# Patient Record
Sex: Female | Born: 1965 | Race: White | Hispanic: No | Marital: Married
Health system: Southern US, Community
[De-identification: ages and names within clinical notes are randomized; demographics above are authoritative.]

## PROBLEM LIST (undated history)

## (undated) ENCOUNTER — Ambulatory Visit: Admission: EM | Source: Home / Self Care

## (undated) DIAGNOSIS — L4050 Arthropathic psoriasis, unspecified: Secondary | ICD-10-CM

## (undated) DIAGNOSIS — Z9889 Other specified postprocedural states: Secondary | ICD-10-CM

## (undated) DIAGNOSIS — I10 Essential (primary) hypertension: Secondary | ICD-10-CM

## (undated) DIAGNOSIS — F329 Major depressive disorder, single episode, unspecified: Secondary | ICD-10-CM

## (undated) DIAGNOSIS — T8859XA Other complications of anesthesia, initial encounter: Secondary | ICD-10-CM

## (undated) DIAGNOSIS — J189 Pneumonia, unspecified organism: Secondary | ICD-10-CM

## (undated) DIAGNOSIS — F319 Bipolar disorder, unspecified: Secondary | ICD-10-CM

## (undated) DIAGNOSIS — F32A Depression, unspecified: Secondary | ICD-10-CM

## (undated) HISTORY — PX: TUBAL LIGATION: SHX77

## (undated) HISTORY — PX: JOINT REPLACEMENT: SHX530

---

## 2014-09-18 ENCOUNTER — Encounter (HOSPITAL_COMMUNITY): Payer: Self-pay | Admitting: Emergency Medicine

## 2014-09-18 ENCOUNTER — Emergency Department (HOSPITAL_COMMUNITY)
Admission: EM | Admit: 2014-09-18 | Discharge: 2014-09-18 | Disposition: A | Discharge status: Home / Self Care | Payer: BC Managed Care – PPO | Attending: Emergency Medicine | Admitting: Emergency Medicine

## 2014-09-18 ENCOUNTER — Emergency Department (HOSPITAL_COMMUNITY): Payer: BC Managed Care – PPO

## 2014-09-18 DIAGNOSIS — J209 Acute bronchitis, unspecified: Secondary | ICD-10-CM | POA: Insufficient documentation

## 2014-09-18 DIAGNOSIS — Z79899 Other long term (current) drug therapy: Secondary | ICD-10-CM | POA: Diagnosis not present

## 2014-09-18 DIAGNOSIS — R0602 Shortness of breath: Secondary | ICD-10-CM

## 2014-09-18 DIAGNOSIS — Z791 Long term (current) use of non-steroidal anti-inflammatories (NSAID): Secondary | ICD-10-CM | POA: Insufficient documentation

## 2014-09-18 DIAGNOSIS — Z91040 Latex allergy status: Secondary | ICD-10-CM | POA: Insufficient documentation

## 2014-09-18 DIAGNOSIS — J40 Bronchitis, not specified as acute or chronic: Secondary | ICD-10-CM

## 2014-09-18 MED ORDER — ALBUTEROL SULFATE (2.5 MG/3ML) 0.083% IN NEBU
5.0000 mg | INHALATION_SOLUTION | Freq: Once | RESPIRATORY_TRACT | Status: AC
  Administered 2014-09-18: 5 mg via RESPIRATORY_TRACT
  Filled 2014-09-18: qty 6

## 2014-09-18 MED ORDER — PREDNISONE 20 MG PO TABS
60.0000 mg | ORAL_TABLET | Freq: Once | ORAL | Status: AC
  Administered 2014-09-18: 60 mg via ORAL
  Filled 2014-09-18: qty 3

## 2014-09-18 MED ORDER — AZITHROMYCIN 250 MG PO TABS: 250.0000 mg | ORAL_TABLET | Freq: Every day | ORAL | Status: DC

## 2014-09-18 MED ORDER — AZITHROMYCIN 250 MG PO TABS
500.0000 mg | ORAL_TABLET | Freq: Once | ORAL | Status: AC
  Administered 2014-09-18: 500 mg via ORAL
  Filled 2014-09-18: qty 2

## 2014-09-18 MED ORDER — ALBUTEROL SULFATE HFA 108 (90 BASE) MCG/ACT IN AERS
2.0000 | INHALATION_SPRAY | RESPIRATORY_TRACT | Status: DC | PRN
  Administered 2014-09-18: 2 via RESPIRATORY_TRACT
  Filled 2014-09-18: qty 6.7

## 2014-09-18 MED ORDER — PREDNISONE 10 MG PO TABS: 20.0000 mg | ORAL_TABLET | Freq: Every day | ORAL | Status: DC

## 2014-09-18 MED ORDER — IPRATROPIUM BROMIDE 0.02 % IN SOLN
0.5000 mg | Freq: Once | RESPIRATORY_TRACT | Status: AC
  Administered 2014-09-18: 0.5 mg via RESPIRATORY_TRACT
  Filled 2014-09-18: qty 2.5

## 2014-09-18 NOTE — ED Notes (Signed)
Alert, NAD, calm, interactive, resps e/u, speaking in clear complete sentences, "feel better", ready to go".

## 2014-09-18 NOTE — ED Provider Notes (Signed)
CSN: [id number]     Arrival date & time 09/18/14  1802 History   First MD Initiated Contact with Patient 09/18/14 2126     Chief Complaint  Patient presents with  . Shortness of Breath     (Consider location/radiation/quality/duration/timing/severity/associated sxs/prior Treatment) HPI  The patient with a past medical history of anxiety and COPD presents to the emergency department with complaints of his congestion, coughing and wheezing. She reports being sick for the past 5 days and then today became acutely short of breath and wheezy today. She therefore came to the emergency department were in triage she was given a breathing treatment. Upon my evaluation she says that this has helped significantly she's feeling much better. She says she continues to have cough and nasal congestion and reports that she typically gets bronchitis once a year. She denies having any fevers, body aches, chills, nausea, vomiting, diarrhea, chest pain. Vital signs are within normal limits.  History reviewed. No pertinent past medical history. History reviewed. No pertinent past surgical history. No family history on file. History  Substance Use Topics  . Smoking status: Never Smoker   . Smokeless tobacco: Not on file  . Alcohol Use: No   OB History   Grav Para Term Preterm Abortions TAB SAB Ect Mult Living                 Review of Systems  10 Systems reviewed and are negative for acute change except as noted in the HPI.   Allergies  Latex  Home Medications   Prior to Admission medications   Medication Sig Start Date End Date Taking? Authorizing Provider  azithromycin (ZITHROMAX) 250 MG tablet Take 1 tablet (250 mg total) by mouth daily. 1 tab every day until finished. 09/19/14   Miron Marxen Irine SealG Marena Witts, PA-C  buPROPion (WELLBUTRIN XL) 300 MG 24 hr tablet Take 300 mg by mouth daily.  09/02/14  Yes Historical Provider, MD  escitalopram (LEXAPRO) 20 MG tablet Take 20 mg by mouth daily.  09/02/14  Yes  Historical Provider, MD  LORazepam (ATIVAN) 1 MG tablet Take 1 mg by mouth daily as needed for anxiety (severe agitation).  09/12/14  Yes Historical Provider, MD  naproxen sodium (ALEVE) 220 MG tablet Take 220 mg by mouth daily as needed (pain).   Yes Historical Provider, MD  predniSONE (DELTASONE) 10 MG tablet Take 2 tablets (20 mg total) by mouth daily. 09/18/14   Monty Mccarrell Irine SealG Capri Raben, PA-C  Pseudoephedrine-APAP-DM (DAYQUIL MULTI-SYMPTOM PO) Take 2 capsules by mouth every 6 (six) hours as needed (cold and flu symptoms).   Yes Historical Provider, MD  triamcinolone ointment (KENALOG) 0.5 % Apply 1 application topically daily as needed (rash).  09/12/14  Yes Historical Provider, MD   BP 126/74  Pulse 76  Temp(Src) 97.4 F (36.3 C) (Oral)  Resp 21  Ht 5\' 8"  (1.727 m)  Wt 275 lb (124.739 kg)  BMI 41.82 kg/m2  SpO2 93% Physical Exam  Nursing note and vitals reviewed. Constitutional: She appears well-developed and well-nourished. No distress.  HENT:  Head: Normocephalic and atraumatic.  Eyes: Pupils are equal, round, and reactive to light.  Neck: Normal range of motion. Neck supple. No spinous process tenderness and no muscular tenderness present.  Cardiovascular: Normal rate and regular rhythm.   Pulmonary/Chest: Effort normal. She has wheezes (very mild and bilateral). She has no rhonchi. She has no rales. She exhibits no tenderness and no laceration.  Coughing during exam  Abdominal: Soft. Bowel sounds are normal. She  exhibits no distension. There is no tenderness.  Neurological: She is alert.  Skin: Skin is warm and dry. She is not diaphoretic. No pallor.      ED Course  Procedures (including critical care time) Labs Review Labs Reviewed - No data to display  Imaging Review Dg Chest 2 View  09/18/2014   CLINICAL DATA:  Short of breath  EXAM: CHEST  2 VIEW  COMPARISON:  None.  FINDINGS: The heart size and mediastinal contours are within normal limits. Both lungs are clear. The  visualized skeletal structures are unremarkable.  IMPRESSION: No active cardiopulmonary disease.   Electronically Signed   By: Marlan Palauharles  Clark M.D.   On: 09/18/2014 19:32     EKG Interpretation None      MDM   Final diagnoses:  Bronchitis    Medications  albuterol (PROVENTIL HFA;VENTOLIN HFA) 108 (90 BASE) MCG/ACT inhaler 2 puff (not administered)  predniSONE (DELTASONE) tablet 60 mg (not administered)  azithromycin (ZITHROMAX) tablet 500 mg (not administered)  albuterol (PROVENTIL) (2.5 MG/3ML) 0.083% nebulizer solution 5 mg (5 mg Nebulization Given 09/18/14 1818)  ipratropium (ATROVENT) nebulizer solution 0.5 mg (0.5 mg Nebulization Given 09/18/14 1819)    Pt feeling much better after nebulizer and her saturations are staying in the upper 90's on room air and with ambulation. She feels well aside from cough and nasal congestion. Her vital signs are all normal. We'll give her an albuterol inhaler for home she has been advised to use it every 6 hours. She's been given a dose of azithromycin and prednisone 60 mg in the emergency department.  She is to follow-up with her PCP sometime in the next few days or return to the ED if her symptoms worsen.  48 y.o.Blima DessertStephanie Trail's evaluation in the Emergency Department is complete. It has been determined that no acute conditions requiring further emergency intervention are present at this time. The patient/guardian have been advised of the diagnosis and plan. We have discussed signs and symptoms that warrant return to the ED, such as changes or worsening in symptoms.  Vital signs are stable at discharge. Filed Vitals:   09/18/14 2230  BP: 126/74  Pulse: 76  Temp:   Resp: 21    Patient/guardian has voiced understanding and agreed to follow-up with the PCP or specialist.     Dorthula Matasiffany G Orean Giarratano, PA-C 09/18/14 2242

## 2014-09-18 NOTE — ED Notes (Signed)
The pt is c/o some difficulty breathing since Thursday.  She has had a cold since then audible wheezes now.  No temp no hx of copd and asthma.  Non-smoker

## 2014-09-18 NOTE — Discharge Instructions (Signed)

## 2014-09-20 NOTE — ED Provider Notes (Signed)
Medical screening examination/treatment/procedure(s) were performed by non-physician practitioner and as supervising physician I was immediately available for consultation/collaboration.   EKG Interpretation None        Tylerjames Hoglund, MD 09/20/14 0141 

## 2016-09-26 DIAGNOSIS — E063 Autoimmune thyroiditis: Secondary | ICD-10-CM | POA: Insufficient documentation

## 2016-09-26 DIAGNOSIS — E0590 Thyrotoxicosis, unspecified without thyrotoxic crisis or storm: Secondary | ICD-10-CM | POA: Insufficient documentation

## 2017-12-29 ENCOUNTER — Emergency Department (HOSPITAL_COMMUNITY): Payer: Worker's Compensation

## 2017-12-29 ENCOUNTER — Encounter (HOSPITAL_COMMUNITY): Payer: Self-pay | Admitting: *Deleted

## 2017-12-29 ENCOUNTER — Emergency Department (HOSPITAL_COMMUNITY)
Admission: EM | Admit: 2017-12-29 | Discharge: 2017-12-29 | Disposition: A | Discharge status: Home / Self Care | Payer: Worker's Compensation | Attending: Emergency Medicine | Admitting: Emergency Medicine

## 2017-12-29 ENCOUNTER — Other Ambulatory Visit: Payer: Self-pay

## 2017-12-29 DIAGNOSIS — Y999 Unspecified external cause status: Secondary | ICD-10-CM | POA: Diagnosis not present

## 2017-12-29 DIAGNOSIS — R079 Chest pain, unspecified: Secondary | ICD-10-CM | POA: Diagnosis not present

## 2017-12-29 DIAGNOSIS — I10 Essential (primary) hypertension: Secondary | ICD-10-CM | POA: Diagnosis not present

## 2017-12-29 DIAGNOSIS — F319 Bipolar disorder, unspecified: Secondary | ICD-10-CM | POA: Diagnosis not present

## 2017-12-29 DIAGNOSIS — S161XXA Strain of muscle, fascia and tendon at neck level, initial encounter: Secondary | ICD-10-CM | POA: Diagnosis not present

## 2017-12-29 DIAGNOSIS — S0990XA Unspecified injury of head, initial encounter: Secondary | ICD-10-CM | POA: Insufficient documentation

## 2017-12-29 DIAGNOSIS — Y9389 Activity, other specified: Secondary | ICD-10-CM | POA: Insufficient documentation

## 2017-12-29 DIAGNOSIS — Y92410 Unspecified street and highway as the place of occurrence of the external cause: Secondary | ICD-10-CM | POA: Insufficient documentation

## 2017-12-29 DIAGNOSIS — S199XXA Unspecified injury of neck, initial encounter: Secondary | ICD-10-CM | POA: Diagnosis present

## 2017-12-29 DIAGNOSIS — M7918 Myalgia, other site: Secondary | ICD-10-CM

## 2017-12-29 HISTORY — DX: Depression, unspecified: F32.A

## 2017-12-29 HISTORY — DX: Major depressive disorder, single episode, unspecified: F32.9

## 2017-12-29 HISTORY — DX: Essential (primary) hypertension: I10

## 2017-12-29 HISTORY — DX: Bipolar disorder, unspecified: F31.9

## 2017-12-29 MED ORDER — ACETAMINOPHEN 325 MG PO TABS
650.0000 mg | ORAL_TABLET | Freq: Once | ORAL | Status: AC
  Administered 2017-12-29: 650 mg via ORAL
  Filled 2017-12-29: qty 2

## 2017-12-29 MED ORDER — CYCLOBENZAPRINE HCL 10 MG PO TABS: 10.0000 mg | ORAL_TABLET | Freq: Two times a day (BID) | ORAL | 0 refills | Status: DC | PRN

## 2017-12-29 NOTE — ED Notes (Signed)
Pt verbalizes understanding of d/c instructions. Pt received prescriptions. Pt ambulatory at d/c with all belongings.  

## 2017-12-29 NOTE — Discharge Instructions (Signed)
Please read attached information. If you experience any new or worsening signs or symptoms please return to the emergency room for evaluation. Please follow-up with your primary care provider or specialist as discussed. Please use medication prescribed only as directed and discontinue taking if you have any concerning signs or symptoms.   °
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
## 2017-12-29 NOTE — ED Notes (Signed)
Patient transported to X-ray 
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
## 2018-12-04 ENCOUNTER — Emergency Department (HOSPITAL_COMMUNITY)
Admission: EM | Admit: 2018-12-04 | Discharge: 2018-12-05 | Disposition: A | Discharge status: Home / Self Care | Payer: BLUE CROSS/BLUE SHIELD | Attending: Emergency Medicine | Admitting: Emergency Medicine

## 2018-12-04 DIAGNOSIS — J0100 Acute maxillary sinusitis, unspecified: Secondary | ICD-10-CM | POA: Diagnosis not present

## 2018-12-04 DIAGNOSIS — G43109 Migraine with aura, not intractable, without status migrainosus: Secondary | ICD-10-CM | POA: Diagnosis not present

## 2018-12-04 DIAGNOSIS — R51 Headache: Secondary | ICD-10-CM | POA: Diagnosis present

## 2018-12-04 DIAGNOSIS — R531 Weakness: Secondary | ICD-10-CM | POA: Diagnosis not present

## 2018-12-04 DIAGNOSIS — I10 Essential (primary) hypertension: Secondary | ICD-10-CM | POA: Insufficient documentation

## 2018-12-04 NOTE — ED Triage Notes (Signed)
Pt reports HA onset 2100, states hx of migraines but this feels different than usual, L sided with pressure behind her eyes. Pt denies any neuro deficits but drift noted during assessment. EDP is aware.

## 2018-12-04 NOTE — ED Notes (Signed)
Dr. Adela Lank @ bedside, call Code Stroke.

## 2018-12-04 NOTE — ED Notes (Signed)
Blood collected and sent to main lab.

## 2018-12-05 ENCOUNTER — Emergency Department (HOSPITAL_COMMUNITY): Payer: BLUE CROSS/BLUE SHIELD

## 2018-12-05 ENCOUNTER — Other Ambulatory Visit: Payer: Self-pay

## 2018-12-05 DIAGNOSIS — R531 Weakness: Secondary | ICD-10-CM

## 2018-12-05 LAB — COMPREHENSIVE METABOLIC PANEL
ALK PHOS: 72 U/L (ref 38–126)
ALT: 20 U/L (ref 0–44)
AST: 14 U/L — ABNORMAL LOW (ref 15–41)
Albumin: 3.3 g/dL — ABNORMAL LOW (ref 3.5–5.0)
Anion gap: 11 (ref 5–15)
BILIRUBIN TOTAL: 0.7 mg/dL (ref 0.3–1.2)
BUN: 9 mg/dL (ref 6–20)
CALCIUM: 8.5 mg/dL — AB (ref 8.9–10.3)
CO2: 26 mmol/L (ref 22–32)
CREATININE: 0.91 mg/dL (ref 0.44–1.00)
Chloride: 102 mmol/L (ref 98–111)
GFR calc non Af Amer: >60 mL/min (ref >60)
Glucose, Bld: 112 mg/dL — ABNORMAL HIGH (ref 70–99)
Potassium: 2.9 mmol/L — ABNORMAL LOW (ref 3.5–5.1)
Sodium: 139 mmol/L (ref 135–145)
TOTAL PROTEIN: 7.1 g/dL (ref 6.5–8.1)

## 2018-12-05 LAB — I-STAT CHEM 8, ED
BUN: 10 mg/dL (ref 6–20)
CHLORIDE: 100 mmol/L (ref 98–111)
Calcium, Ion: 0.96 mmol/L — ABNORMAL LOW (ref 1.15–1.40)
Creatinine, Ser: 0.9 mg/dL (ref 0.44–1.00)
Glucose, Bld: 114 mg/dL — ABNORMAL HIGH (ref 70–99)
HEMATOCRIT: 44 % (ref 36.0–46.0)
Hemoglobin: 15 g/dL (ref 12.0–15.0)
Potassium: 2.9 mmol/L — ABNORMAL LOW (ref 3.5–5.1)
Sodium: 139 mmol/L (ref 135–145)
TCO2: 27 mmol/L (ref 22–32)

## 2018-12-05 LAB — PROTIME-INR
INR: 0.98
Prothrombin Time: 12.9 seconds (ref 11.4–15.2)

## 2018-12-05 LAB — DIFFERENTIAL
Abs Immature Granulocytes: 0.08 10*3/uL — ABNORMAL HIGH (ref 0.00–0.07)
BASOS PCT: 0 %
Basophils Absolute: 0.1 10*3/uL (ref 0.0–0.1)
Eosinophils Absolute: 0.5 10*3/uL (ref 0.0–0.5)
Eosinophils Relative: 4 %
Immature Granulocytes: 1 %
LYMPHS PCT: 22 %
Lymphs Abs: 2.6 10*3/uL (ref 0.7–4.0)
MONO ABS: 0.7 10*3/uL (ref 0.1–1.0)
Monocytes Relative: 6 %
NEUTROS ABS: 8 10*3/uL — AB (ref 1.7–7.7)
NEUTROS PCT: 67 %

## 2018-12-05 LAB — CBC
HCT: 43 % (ref 36.0–46.0)
HEMOGLOBIN: 13.5 g/dL (ref 12.0–15.0)
MCH: 27.4 pg (ref 26.0–34.0)
MCHC: 31.4 g/dL (ref 30.0–36.0)
MCV: 87.4 fL (ref 80.0–100.0)
Platelets: 258 10*3/uL (ref 150–400)
RBC: 4.92 MIL/uL (ref 3.87–5.11)
RDW: 15 % (ref 11.5–15.5)
WBC: 11.8 10*3/uL — ABNORMAL HIGH (ref 4.0–10.5)
nRBC: 0 % (ref 0.0–0.2)

## 2018-12-05 LAB — I-STAT TROPONIN, ED: TROPONIN I, POC: 0 ng/mL (ref 0.00–0.08)

## 2018-12-05 LAB — APTT: APTT: 34 s (ref 24–36)

## 2018-12-05 LAB — I-STAT BETA HCG BLOOD, ED (MC, WL, AP ONLY): I-stat hCG, quantitative: <5 m[IU]/mL (ref <5)

## 2018-12-05 MED ORDER — METOCLOPRAMIDE HCL 5 MG/ML IJ SOLN
10.0000 mg | Freq: Once | INTRAMUSCULAR | Status: AC
  Administered 2018-12-05: 10 mg via INTRAVENOUS
  Filled 2018-12-05: qty 2

## 2018-12-05 MED ORDER — POTASSIUM CHLORIDE CRYS ER 20 MEQ PO TBCR
40.0000 meq | EXTENDED_RELEASE_TABLET | Freq: Once | ORAL | Status: AC
  Administered 2018-12-05: 40 meq via ORAL
  Filled 2018-12-05: qty 2

## 2018-12-05 MED ORDER — DIPHENHYDRAMINE HCL 50 MG/ML IJ SOLN
25.0000 mg | Freq: Once | INTRAMUSCULAR | Status: AC
  Administered 2018-12-05: 25 mg via INTRAVENOUS
  Filled 2018-12-05: qty 1

## 2018-12-05 MED ORDER — LORAZEPAM 2 MG/ML IJ SOLN
1.0000 mg | Freq: Once | INTRAMUSCULAR | Status: AC
  Administered 2018-12-05: 1 mg via INTRAVENOUS
  Filled 2018-12-05: qty 1

## 2018-12-05 MED ORDER — PROCHLORPERAZINE EDISYLATE 10 MG/2ML IJ SOLN
10.0000 mg | Freq: Once | INTRAMUSCULAR | Status: AC
  Administered 2018-12-05: 10 mg via INTRAVENOUS
  Filled 2018-12-05: qty 2

## 2018-12-05 MED ORDER — MAGNESIUM OXIDE 400 (241.3 MG) MG PO TABS
800.0000 mg | ORAL_TABLET | Freq: Once | ORAL | Status: AC
  Administered 2018-12-05: 800 mg via ORAL
  Filled 2018-12-05: qty 2

## 2018-12-05 MED ORDER — IOPAMIDOL (ISOVUE-370) INJECTION 76%
75.0000 mL | Freq: Once | INTRAVENOUS | Status: AC | PRN
  Administered 2018-12-05: 75 mL via INTRAVENOUS

## 2018-12-05 MED ORDER — AMOXICILLIN-POT CLAVULANATE 875-125 MG PO TABS: 1.0000 | ORAL_TABLET | Freq: Two times a day (BID) | ORAL | 0 refills | Status: DC

## 2018-12-05 NOTE — Consult Note (Signed)
Neurology Consultation Reason for Consult: Left-sided weakness Referring Physician: Adela Lank, D  CC: Headache  History is obtained from: Patient  HPI: Robin Castaneda is a 53 y.o. female with a history of migraines, bipolar disorder who presents with headache which is left-sided in location, extending from her neck to the top of her head.  She does have a history of migraines, though states that this feels slightly different than her typical migraine.  She denies any numbness, weakness, nausea, vomiting, double vision.  Because of her complaint of headache, NIH was performed by triage and she was found to have some left-sided drift and therefore code stroke was activated.   LKW: Unknown tpa given?: no, unknown time of onset  ROS: A 14 point ROS was performed and is negative except as noted in the HPI.   Past Medical History:  Diagnosis Date  . Bipolar 1 disorder (HCC)   . Depression   . Hypertension      No family history on file.   Social History:  reports that she has never smoked. She has never used smokeless tobacco. She reports that she does not drink alcohol or use drugs.   Exam: Current vital signs: BP (!) 132/92 (BP Location: Right Arm)   Pulse 79   Temp 98.4 F (36.9 C) (Oral)   Resp 18   SpO2 93%  Vital signs in last 24 hours: Temp:  [98.4 F (36.9 C)] 98.4 F (36.9 C) (01/10 2322) Pulse Rate:  [79] 79 (01/10 2322) Resp:  [18] 18 (01/10 2322) BP: (132)/(92) 132/92 (01/10 2322) SpO2:  [93 %] 93 % (01/10 2322)   Physical Exam  Constitutional: Appears well-developed and well-nourished.  Psych: Affect appropriate to situation Eyes: No scleral injection HENT: No OP obstrucion Head: Normocephalic.  Cardiovascular: Normal rate and regular rhythm.  Respiratory: Effort normal, non-labored breathing GI: Soft.  No distension. There is no tenderness.  Skin: WDI  Neuro: Mental Status: Patient is awake, alert, oriented to person, place, month, year, and  situation. Patient is able to give a clear and coherent history. No signs of aphasia or neglect Cranial Nerves: II: Visual Fields are full. Pupils are equal, round, and reactive to light.   III,IV, VI: EOMI without ptosis or diploplia.  V: Facial sensation is symmetric to temperature VII: Facial movement is symmetric.  VIII: hearing is intact to voice X: Uvula elevates symmetrically XI: Shoulder shrug is symmetric. XII: tongue is midline without atrophy or fasciculations.  Motor: Tone is normal. Bulk is normal.  She has 4+/5 strength in her left arm and leg, with mild drift in the left arm but not leg.?  Some giveaway component in the leg. Sensory: Sensation is symmetric to light touch and temperature in the arms and legs. Cerebellar: No clear ataxia on finger-nose-finger or urination.     I have reviewed labs in epic and the results pertinent to this consultation are: Chem-8- creatinine 0.9  I have reviewed the images obtained: CT head- no acute findings  Impression: 53 year old female with acute left-sided headache and mild left-sided weakness to which she is unaware.  Given that this is of unclear time of onset, she is not a TPA candidate.  I suspect that this may be complicated migraine, but I would favor doing CTA head and neck to rule out dissection as well as MRI to rule out stroke  Recommendations: 1) CTA head neck 2) MRI brain 3) if this is negative, would treat as complicated migraine   Ritta Slot,  MD [facility] [phone]  If 7pm- 7am, please page neurology on call as listed in [place].

## 2018-12-05 NOTE — ED Provider Notes (Signed)
[facility]   CSN: [id number] Arrival date & time: 12/04/18  2239     History   Chief Complaint Chief Complaint  Patient presents with  . Migraine    HPI Robin Castaneda is a 53 y.o. female.  53 yo F with a chief complaint of headache.  Patient has a history of recurrent headaches but think this 1 feels different.  Occurred while she was at rest watching TV.  It is left-sided and throbbing.  Worse with exertion and better with rest.  Her normal headaches are worse with bright lights but this 1 is not impacted by lights.  She has had some nausea but denies vomiting.  Feels that the pain radiates down to her neck.  Symptoms started about 3 hours ago.  The history is provided by the patient.  Migraine  This is a recurrent problem. The current episode started 3 to 5 hours ago. The problem occurs constantly. The problem has not changed since onset.Associated symptoms include headaches. Pertinent negatives include no chest pain, no abdominal pain and no shortness of breath. The symptoms are aggravated by exertion. Nothing relieves the symptoms. She has tried nothing for the symptoms. The treatment provided no relief.    Past Medical History:  Diagnosis Date  . Bipolar 1 disorder (HCC)   . Depression   . Hypertension     There are no active problems to display for this patient.   Past Surgical History:  Procedure Laterality Date  . JOINT REPLACEMENT    . TUBAL LIGATION       OB History   No obstetric history on file.      Home Medications    Prior to Admission medications   Medication Sig Start Date End Date Taking? Authorizing Provider  amoxicillin-clavulanate (AUGMENTIN) 875-125 MG tablet Take 1 tablet by mouth 2 (two) times daily. 12/05/18   Melene PlanFloyd, Hudsyn Champine, DO  cyclobenzaprine (FLEXERIL) 10 MG tablet Take 1 tablet (10 mg total) by mouth 2 (two) times daily as needed for muscle spasms. 12/29/17   Eyvonne MechanicHedges, Jeffrey, PA-C      Family History No family history on file.  Social History Social History   Tobacco Use  . Smoking status: Never Smoker  . Smokeless tobacco: Never Used  Substance Use Topics  . Alcohol use: No    Frequency: Never  . Drug use: No     Allergies   Latex   Review of Systems Review of Systems  Constitutional: Negative for chills and fever.  HENT: Negative for congestion and rhinorrhea.   Eyes: Negative for redness and visual disturbance.  Respiratory: Negative for shortness of breath and wheezing.   Cardiovascular: Negative for chest pain and palpitations.  Gastrointestinal: Negative for abdominal pain, nausea and vomiting.  Genitourinary: Negative for dysuria and urgency.  Musculoskeletal: Negative for arthralgias and myalgias.  Skin: Negative for pallor and wound.  Neurological: Positive for headaches. Negative for dizziness.     Physical Exam Updated Vital Signs BP 124/85   Pulse 80   Temp 98.4 F (36.9 C) (Oral)   Resp 16   Ht 5\' 8"  (1.727 m)   Wt 130.2 kg   SpO2 96%   BMI 43.64 kg/m   Physical Exam Vitals signs and nursing note reviewed.  Constitutional:      General: She is not in acute distress.    Appearance: She is well-developed. She is not diaphoretic.  HENT:     Head: Normocephalic and atraumatic.  Eyes:     Pupils: Pupils are equal, round, and reactive to light.  Neck:     Musculoskeletal: Normal range of motion and neck supple.  Cardiovascular:     Rate and Rhythm: Normal rate and regular rhythm.     Heart sounds: No murmur. No friction rub. No gallop.   Pulmonary:     Effort: Pulmonary effort is normal.     Breath sounds: No wheezing or rales.  Abdominal:     General: There is no distension.     Palpations: Abdomen is soft.     Tenderness: There is no abdominal tenderness.  Musculoskeletal:        General: No tenderness.  Skin:    General: Skin is warm and dry.  Neurological:     Mental Status: She is alert and oriented to  person, place, and time.     Cranial Nerves: Cranial nerves are intact.     Sensory: Sensation is intact.     Coordination: Coordination is intact.     Comments: 4 out of 5 muscle strength of the left upper extremity.  Positive for drift in the left upper extremity.  Psychiatric:        Behavior: Behavior normal.      ED Treatments / Results  Labs (all labs ordered are listed, but only abnormal results are displayed) Labs Reviewed  CBC - Abnormal; Notable for the following components:      Result Value   WBC 11.8 (*)    All other components within normal limits  DIFFERENTIAL - Abnormal; Notable for the following components:   Neutro Abs 8.0 (*)    Abs Immature Granulocytes 0.08 (*)    All other components within normal limits  COMPREHENSIVE METABOLIC PANEL - Abnormal; Notable for the following components:   Potassium 2.9 (*)    Glucose, Bld 112 (*)    Calcium 8.5 (*)    Albumin 3.3 (*)    AST 14 (*)    All other components within normal limits  I-STAT CHEM 8, ED - Abnormal; Notable for the following components:   Potassium 2.9 (*)    Glucose, Bld 114 (*)    Calcium, Ion 0.96 (*)    All other components within normal limits  PROTIME-INR  APTT  I-STAT TROPONIN, ED  CBG MONITORING, ED  I-STAT BETA HCG BLOOD, ED ([facility], [facility], [facility] ONLY)    EKG EKG Interpretation  Date/Time:  Friday December 04 2018 23:46:23 EST Ventricular Rate:  78 PR Interval:  158 QRS Duration: 88 QT Interval:  390 QTC Calculation: 444 R Axis:   13 Text Interpretation:  Normal sinus rhythm Cannot rule out Anterior infarct , age undetermined Abnormal ECG No old tracing to compare Confirmed by Melene Plan [phone]) on 12/04/2018 11:55:26 PM   Radiology Ct Angio Head W Or Wo Contrast  Result Date: 12/05/2018 CLINICAL DATA:  53 y/o  F; left arm weakness.  Stroke follow-up. EXAM: CT ANGIOGRAPHY HEAD AND NECK TECHNIQUE: Multidetector CT imaging of the head and neck was performed using the standard protocol  during bolus administration of intravenous contrast. Multiplanar CT image reconstructions and MIPs were obtained to evaluate the vascular anatomy. Carotid stenosis measurements (when applicable) are obtained utilizing NASCET criteria, using the distal internal carotid diameter as the denominator. CONTRAST:  75mL ISOVUE-370 IOPAMIDOL (ISOVUE-370) INJECTION 76% COMPARISON:  12/04/2018 CT head FINDINGS: CTA NECK FINDINGS Aortic arch: Standard branching. Imaged portion shows no evidence of aneurysm or dissection. No significant stenosis of the major  arch vessel origins. Right carotid system: No evidence of dissection, stenosis (50% or greater) or occlusion. Brief retropharyngeal course of common carotid artery. Left carotid system: No evidence of dissection, stenosis (50% or greater) or occlusion. Brief retropharyngeal course of common carotid artery. Vertebral arteries: Left dominant. No evidence of dissection, stenosis (50% or greater) or occlusion. Skeleton: Negative. Other neck: Mild anterior upper cervical lymphadenopathy bilaterally. Upper chest: Negative. Review of the MIP images confirms the above findings CTA HEAD FINDINGS Anterior circulation: No significant stenosis, proximal occlusion, aneurysm, or vascular malformation. Posterior circulation: No significant stenosis, proximal occlusion, aneurysm, or vascular malformation. Venous sinuses: As permitted by contrast timing, patent. Anatomic variants: Anterior communicating artery and right posterior communicating artery. Delayed phase: No abnormal intracranial enhancement. Review of the MIP images confirms the above findings IMPRESSION: 1. Patent carotid and vertebral arteries. No dissection, aneurysm, or hemodynamically significant stenosis utilizing NASCET criteria. 2. Patent anterior and posterior intracranial circulation. No large vessel occlusion, aneurysm, or significant stenosis. 3. Mild upper cervical adenopathy and sinus disease, likely upper  respiratory infection. These results were called by telephone at the time of interpretation on 12/05/2018 at 12:35 am to Dr. Ritta Slot , who verbally acknowledged these results. Electronically Signed   By: Mitzi Hansen M.D.   On: 12/05/2018 00:36   Ct Angio Neck W Or Wo Contrast  Result Date: 12/05/2018 CLINICAL DATA:  53 y/o  F; left arm weakness.  Stroke follow-up. EXAM: CT ANGIOGRAPHY HEAD AND NECK TECHNIQUE: Multidetector CT imaging of the head and neck was performed using the standard protocol during bolus administration of intravenous contrast. Multiplanar CT image reconstructions and MIPs were obtained to evaluate the vascular anatomy. Carotid stenosis measurements (when applicable) are obtained utilizing NASCET criteria, using the distal internal carotid diameter as the denominator. CONTRAST:  75mL ISOVUE-370 IOPAMIDOL (ISOVUE-370) INJECTION 76% COMPARISON:  12/04/2018 CT head FINDINGS: CTA NECK FINDINGS Aortic arch: Standard branching. Imaged portion shows no evidence of aneurysm or dissection. No significant stenosis of the major arch vessel origins. Right carotid system: No evidence of dissection, stenosis (50% or greater) or occlusion. Brief retropharyngeal course of common carotid artery. Left carotid system: No evidence of dissection, stenosis (50% or greater) or occlusion. Brief retropharyngeal course of common carotid artery. Vertebral arteries: Left dominant. No evidence of dissection, stenosis (50% or greater) or occlusion. Skeleton: Negative. Other neck: Mild anterior upper cervical lymphadenopathy bilaterally. Upper chest: Negative. Review of the MIP images confirms the above findings CTA HEAD FINDINGS Anterior circulation: No significant stenosis, proximal occlusion, aneurysm, or vascular malformation. Posterior circulation: No significant stenosis, proximal occlusion, aneurysm, or vascular malformation. Venous sinuses: As permitted by contrast timing, patent. Anatomic  variants: Anterior communicating artery and right posterior communicating artery. Delayed phase: No abnormal intracranial enhancement. Review of the MIP images confirms the above findings IMPRESSION: 1. Patent carotid and vertebral arteries. No dissection, aneurysm, or hemodynamically significant stenosis utilizing NASCET criteria. 2. Patent anterior and posterior intracranial circulation. No large vessel occlusion, aneurysm, or significant stenosis. 3. Mild upper cervical adenopathy and sinus disease, likely upper respiratory infection. These results were called by telephone at the time of interpretation on 12/05/2018 at 12:35 am to Dr. Ritta Slot , who verbally acknowledged these results. Electronically Signed   By: Mitzi Hansen M.D.   On: 12/05/2018 00:36   Mr Brain Wo Contrast  Result Date: 12/05/2018 CLINICAL DATA:  53 y/o F; acute left-sided headache and mild left-sided weakness. EXAM: MRI HEAD WITHOUT CONTRAST TECHNIQUE: Multiplanar, multiecho pulse sequences of the  brain and surrounding structures were obtained without intravenous contrast. COMPARISON:  12/04/2018 CT head and CTA head. FINDINGS: Brain: Mild motion degradation of several sequences. No acute infarction, hemorrhage, hydrocephalus, extra-axial collection or mass lesion. No structural or signal abnormality of the brain identified. Vascular: Normal flow voids. Skull and upper cervical spine: Normal marrow signal. Sinuses/Orbits: Extensive ethmoid, maxillary, and sphenoid sinus mucosal thickening. Sinus fluid levels. No significant abnormal signal of mastoid air cells. Orbits are unremarkable. Other: None. IMPRESSION: 1. No acute intracranial abnormality identified. Unremarkable MRI of the brain. 2. Paranasal sinus disease with fluid levels compatible with acute sinusitis. Electronically Signed   By: Mitzi Hansen M.D.   On: 12/05/2018 04:53   Ct Head Code Stroke Wo Contrast  Result Date: 12/05/2018 CLINICAL  DATA:  Code stroke.  53 y/o  F; left arm weakness. EXAM: CT HEAD WITHOUT CONTRAST TECHNIQUE: Contiguous axial images were obtained from the base of the skull through the vertex without intravenous contrast. COMPARISON:  12/29/2017 CT head FINDINGS: Brain: No evidence of acute infarction, hemorrhage, hydrocephalus, extra-axial collection or mass lesion/mass effect. Vascular: No hyperdense vessel or unexpected calcification. Skull: Normal. Negative for fracture or focal lesion. Sinuses/Orbits: Extensive paranasal sinus disease predominantly in maxillary and sphenoid sinuses with fluid levels. Normal aeration of the mastoid air cells. Orbits are unremarkable. Other: None. ASPECTS [facility] Stroke Program Early CT Score) - Ganglionic level infarction (caudate, lentiform nuclei, internal capsule, insula, M1-M3 cortex): 7 - Supraganglionic infarction (M4-M6 cortex): 3 Total score (0-10 with 10 being normal): 10 IMPRESSION: 1. No acute intracranial abnormality identified. Stable unremarkable CT of the brain. 2. ASPECTS is 10 3. Extensive paranasal sinus disease with fluid levels which may represent acute sinusitis in the appropriate clinical setting. These results were communicated to Dr. Amada Jupiter at 12:18 amon 1/11/2020by text page via the [facility] messaging system. Electronically Signed   By: Mitzi Hansen M.D.   On: 12/05/2018 00:19    Procedures Procedures (including critical care time)  Medications Ordered in ED Medications  iopamidol (ISOVUE-370) 76 % injection 75 mL (75 mLs Intravenous Contrast Given 12/05/18 0009)  potassium chloride SA (K-DUR,KLOR-CON) CR tablet 40 mEq (40 mEq Oral Given 12/05/18 0103)  magnesium oxide (MAG-OX) tablet 800 mg (800 mg Oral Given 12/05/18 0103)  prochlorperazine (COMPAZINE) injection 10 mg (10 mg Intravenous Given 12/05/18 0103)  diphenhydrAMINE (BENADRYL) injection 25 mg (25 mg Intravenous Given 12/05/18 0102)  LORazepam (ATIVAN) injection 1 mg (1 mg Intravenous  Given 12/05/18 0324)  metoCLOPramide (REGLAN) injection 10 mg (10 mg Intravenous Given 12/05/18 0142)  diphenhydrAMINE (BENADRYL) injection 25 mg (25 mg Intravenous Given 12/05/18 0141)     Initial Impression / Assessment and Plan / ED Course  I have reviewed the triage vital signs and the nursing notes.  Pertinent labs & imaging results that were available during my care of the patient were reviewed by me and considered in my medical decision making (see chart for details).     53 yo F with sudden onset headache that started about 3 hours ago and associated with left upper extremity weakness.  I was called to triage to evaluate the patient.  She has had no history of the same this is most likely a complicated migraine though since this is different than her prior headaches and has a new focal neurologic deficit and will make her a code stroke.  Seen by neurology who felt this is most likely to be a complicated migraine that recommended a CT angiogram of the head and  neck which was negative and he recommended an MRI.  Patient with a mildly low potassium was given replacement here.  MRI returned negative for acute intracranial pathology but likely sinusitis.  Will start on antibiotics.  PCP follow-up.   5:38 AM:  I have discussed the diagnosis/risks/treatment options with the patient and believe the pt to be eligible for discharge home to follow-up with PCP. We also discussed returning to the ED immediately if new or worsening sx occur. We discussed the sx which are most concerning (e.g., sudden worsening pain, fever, inability to tolerate by mouth) that necessitate immediate return. Medications administered to the patient during their visit and any new prescriptions provided to the patient are listed below.  Medications given during this visit Medications  iopamidol (ISOVUE-370) 76 % injection 75 mL (75 mLs Intravenous Contrast Given 12/05/18 0009)  potassium chloride SA (K-DUR,KLOR-CON) CR tablet  40 mEq (40 mEq Oral Given 12/05/18 0103)  magnesium oxide (MAG-OX) tablet 800 mg (800 mg Oral Given 12/05/18 0103)  prochlorperazine (COMPAZINE) injection 10 mg (10 mg Intravenous Given 12/05/18 0103)  diphenhydrAMINE (BENADRYL) injection 25 mg (25 mg Intravenous Given 12/05/18 0102)  LORazepam (ATIVAN) injection 1 mg (1 mg Intravenous Given 12/05/18 0324)  metoCLOPramide (REGLAN) injection 10 mg (10 mg Intravenous Given 12/05/18 0142)  diphenhydrAMINE (BENADRYL) injection 25 mg (25 mg Intravenous Given 12/05/18 0141)      The patient appears reasonably screen and/or stabilized for discharge and I doubt any other medical condition or other [facility] requiring further screening, evaluation, or treatment in the ED at this time prior to discharge.     Final Clinical Impressions(s) / ED Diagnoses   Final diagnoses:  Complicated migraine  Acute maxillary sinusitis, recurrence not specified    ED Discharge Orders         Ordered    amoxicillin-clavulanate (AUGMENTIN) 875-125 MG tablet  2 times daily     12/05/18 0513    Ambulatory referral to Neurology     12/05/18 0513           Melene PlanFloyd, Brelyn Woehl, DO 12/05/18 [phone]

## 2018-12-07 ENCOUNTER — Encounter (HOSPITAL_COMMUNITY): Payer: Self-pay | Admitting: Emergency Medicine

## 2018-12-07 ENCOUNTER — Encounter: Payer: Self-pay | Admitting: Neurology

## 2019-02-15 ENCOUNTER — Ambulatory Visit: Payer: Self-pay | Admitting: Neurology

## 2019-03-02 ENCOUNTER — Encounter: Payer: Self-pay | Admitting: Neurology

## 2019-07-23 ENCOUNTER — Emergency Department (HOSPITAL_COMMUNITY)
Admission: EM | Admit: 2019-07-23 | Discharge: 2019-07-23 | Disposition: A | Discharge status: Home / Self Care | Payer: BC Managed Care – PPO | Attending: Emergency Medicine | Admitting: Emergency Medicine

## 2019-07-23 ENCOUNTER — Emergency Department (HOSPITAL_COMMUNITY): Payer: BC Managed Care – PPO

## 2019-07-23 ENCOUNTER — Encounter: Payer: Self-pay | Admitting: Emergency Medicine

## 2019-07-23 DIAGNOSIS — I10 Essential (primary) hypertension: Secondary | ICD-10-CM | POA: Insufficient documentation

## 2019-07-23 DIAGNOSIS — R079 Chest pain, unspecified: Secondary | ICD-10-CM | POA: Diagnosis present

## 2019-07-23 DIAGNOSIS — E876 Hypokalemia: Secondary | ICD-10-CM | POA: Diagnosis not present

## 2019-07-23 DIAGNOSIS — Z79899 Other long term (current) drug therapy: Secondary | ICD-10-CM | POA: Diagnosis not present

## 2019-07-23 DIAGNOSIS — R0789 Other chest pain: Secondary | ICD-10-CM | POA: Diagnosis not present

## 2019-07-23 LAB — BASIC METABOLIC PANEL
Anion gap: 11 (ref 5–15)
BUN: 11 mg/dL (ref 6–20)
CO2: 27 mmol/L (ref 22–32)
Calcium: 8.7 mg/dL — ABNORMAL LOW (ref 8.9–10.3)
Chloride: 101 mmol/L (ref 98–111)
Creatinine, Ser: 0.82 mg/dL (ref 0.44–1.00)
GFR calc Af Amer: >60 mL/min (ref >60)
GFR calc non Af Amer: >60 mL/min (ref >60)
Glucose, Bld: 116 mg/dL — ABNORMAL HIGH (ref 70–99)
Potassium: 2.9 mmol/L — ABNORMAL LOW (ref 3.5–5.1)
Sodium: 139 mmol/L (ref 135–145)

## 2019-07-23 LAB — CBC
HCT: 42.3 % (ref 36.0–46.0)
Hemoglobin: 13.6 g/dL (ref 12.0–15.0)
MCH: 28.2 pg (ref 26.0–34.0)
MCHC: 32.2 g/dL (ref 30.0–36.0)
MCV: 87.6 fL (ref 80.0–100.0)
Platelets: 256 10*3/uL (ref 150–400)
RBC: 4.83 MIL/uL (ref 3.87–5.11)
RDW: 14.5 % (ref 11.5–15.5)
WBC: 15.2 10*3/uL — ABNORMAL HIGH (ref 4.0–10.5)
nRBC: 0 % (ref 0.0–0.2)

## 2019-07-23 LAB — TROPONIN I (HIGH SENSITIVITY)
Troponin I (High Sensitivity): 5 ng/L (ref <18)
Troponin I (High Sensitivity): 3 ng/L (ref <18)

## 2019-07-23 MED ORDER — POTASSIUM CHLORIDE ER 10 MEQ PO TBCR: 30.0000 meq | EXTENDED_RELEASE_TABLET | Freq: Every day | ORAL | 0 refills | Status: DC

## 2019-07-23 MED ORDER — ACETAMINOPHEN 500 MG PO TABS
1000.0000 mg | ORAL_TABLET | Freq: Once | ORAL | Status: AC
  Administered 2019-07-23: 1000 mg via ORAL
  Filled 2019-07-23: qty 2

## 2019-07-23 MED ORDER — POTASSIUM CHLORIDE CRYS ER 20 MEQ PO TBCR
60.0000 meq | EXTENDED_RELEASE_TABLET | Freq: Once | ORAL | Status: AC
  Administered 2019-07-23: 60 meq via ORAL
  Filled 2019-07-23: qty 3

## 2019-07-23 MED ORDER — SODIUM CHLORIDE 0.9% FLUSH: 3.0000 mL | Freq: Once | INTRAVENOUS | Status: DC

## 2019-07-23 NOTE — Discharge Instructions (Signed)
It is important for you to take the potassium as directed. Follow-up with your primary care provider. Return to the ED if you develop worsening chest pain, develop numbness in your arms or legs, coughing up blood, leg swelling or inability to walk.

## 2019-07-23 NOTE — ED Notes (Signed)
Patient Alert and oriented to baseline. Stable and ambulatory to baseline. Patient verbalized understanding of the discharge instructions.  Patient belongings were taken by the patient.   

## 2019-07-23 NOTE — ED Triage Notes (Signed)
Pt to ED with c/o left chest pain under left breast radiating into left upper back.  Onset this am  Pt also c/o HTN.

## 2019-07-23 NOTE — ED Provider Notes (Signed)
[facility]ER[facility]ENCY DEPARTMENT Provider Note   CSN: [id number] Arrival date & time: 07/23/19  1625     History   Chief Complaint Chief Complaint  Patient presents with  . Hypertension  . Chest Pain    HPI Robin Castaneda is a 53 y.o. female with a past medical history of hypertension currently on chlorthalidone presents to ED for left-sided chest pain that began while she was driving.  States that she pulled over, checked her blood pressure and found that she was hypertensive to 144R systolic.  She called her PCP and was told to take another dose of her blood pressure medication which she did.  States that her blood pressure still read high.  She complains of of headache that has gradually improved.  She has not tried medications to help with her headache or chest pain.  Denies any leg swelling, shortness of breath, vomiting, fever, cough, history of DVT, PE or MI, recent immobilization, vision changes, numbness in arms or legs.     HPI  Past Medical History:  Diagnosis Date  . Bipolar 1 disorder ([place])   . Depression   . Hypertension     There are no active problems to display for this patient.   Past Surgical History:  Procedure Laterality Date  . JOINT REPLACEMENT    . TUBAL LIGATION       OB History   No obstetric history on file.      Home Medications    Prior to Admission medications   Medication Sig Start Date End Date Taking? Authorizing Provider  amoxicillin-clavulanate (AUGMENTIN) 875-125 MG tablet Take 1 tablet by mouth 2 (two) times daily. 12/05/18   Deno Etienne, DO  azithromycin (ZITHROMAX) 250 MG tablet Take 1 tablet (250 mg total) by mouth daily. 1 tab every day until finished. 09/19/14   Delos Haring, PA-C  buPROPion (WELLBUTRIN XL) 300 MG 24 hr tablet Take 300 mg by mouth daily.  09/02/14   [provider]  cyclobenzaprine (FLEXERIL) 10 MG tablet Take 1 tablet (10 mg total) by mouth 2 (two) times daily as needed for  muscle spasms. 12/29/17   Hedges, Dellis Filbert, PA-C  escitalopram (LEXAPRO) 20 MG tablet Take 20 mg by mouth daily.  09/02/14   [provider]  LORazepam (ATIVAN) 1 MG tablet Take 1 mg by mouth daily as needed for anxiety (severe agitation).  09/12/14   [provider]  naproxen sodium (ALEVE) 220 MG tablet Take 220 mg by mouth daily as needed (pain).    [provider]  potassium chloride (K-DUR) 10 MEQ tablet Take 3 tablets (30 mEq total) by mouth daily for 4 days. 07/23/19 07/27/19  Romina Divirgilio, PA-C  predniSONE (DELTASONE) 10 MG tablet Take 2 tablets (20 mg total) by mouth daily. 09/18/14   Carlota Raspberry, Tiffany, PA-C  Pseudoephedrine-APAP-DM (DAYQUIL MULTI-SYMPTOM PO) Take 2 capsules by mouth every 6 (six) hours as needed (cold and flu symptoms).    [provider]  triamcinolone ointment (KENALOG) 0.5 % Apply 1 application topically daily as needed (rash).  09/12/14   [provider]    Family History No family history on file.  Social History Social History   Tobacco Use  . Smoking status: Never Smoker  . Smokeless tobacco: Never Used  Substance Use Topics  . Alcohol use: No    Frequency: Never  . Drug use: No     Allergies   Latex and Latex   Review of Systems Review of Systems  Constitutional: Negative for appetite change, chills and fever.  HENT: Negative for ear pain, rhinorrhea, sneezing and sore throat.   Eyes: Negative for photophobia and visual disturbance.  Respiratory: Negative for cough, chest tightness, shortness of breath and wheezing.   Cardiovascular: Positive for chest pain. Negative for palpitations.  Gastrointestinal: Negative for abdominal pain, blood in stool, constipation, diarrhea, nausea and vomiting.  Genitourinary: Negative for dysuria, hematuria and urgency.  Musculoskeletal: Negative for myalgias.  Skin: Negative for rash.  Neurological: Positive for headaches. Negative for dizziness, weakness and  light-headedness.     Physical Exam Updated Vital Signs BP 131/80   Pulse 69   Temp 98 F (36.7 C) (Oral)   Resp 13   Ht 5\' 8"  (1.727 m)   Wt 130.6 kg   SpO2 98%   BMI 43.79 kg/m   Physical Exam Vitals signs and nursing note reviewed.  Constitutional:      General: She is not in acute distress.    Appearance: She is well-developed. She is obese.     Comments: Speaking in complete sentences without difficulty.  HENT:     Head: Normocephalic and atraumatic.     Nose: Nose normal.  Eyes:     General: No scleral icterus.       Left eye: No discharge.     Conjunctiva/sclera: Conjunctivae normal.     Pupils: Pupils are equal, round, and reactive to light.  Neck:     Musculoskeletal: Normal range of motion and neck supple.  Cardiovascular:     Rate and Rhythm: Normal rate and regular rhythm.     Heart sounds: Normal heart sounds. No murmur. No friction rub. No gallop.   Pulmonary:     Effort: Pulmonary effort is normal. No respiratory distress.     Breath sounds: Normal breath sounds.  Abdominal:     General: Bowel sounds are normal. There is no distension.     Palpations: Abdomen is soft.     Tenderness: There is no abdominal tenderness. There is no guarding.  Musculoskeletal: Normal range of motion.     Comments: No lower extremity edema, erythema or calf tenderness bilaterally.  Skin:    General: Skin is warm and dry.     Findings: No rash.  Neurological:     General: No focal deficit present.     Mental Status: She is alert and oriented to person, place, and time.     Cranial Nerves: No cranial nerve deficit.     Sensory: No sensory deficit.     Motor: No weakness or abnormal muscle tone.     Coordination: Coordination normal.      ED Treatments / Results  Labs (all labs ordered are listed, but only abnormal results are displayed) Labs Reviewed  BASIC METABOLIC PANEL - Abnormal; Notable for the following components:      Result Value   Potassium 2.9 (*)     Glucose, Bld 116 (*)    Calcium 8.7 (*)    All other components within normal limits  CBC - Abnormal; Notable for the following components:   WBC 15.2 (*)    All other components within normal limits  TROPONIN I (HIGH SENSITIVITY)  TROPONIN I (HIGH SENSITIVITY)    EKG EKG Interpretation  Date/Time:  Friday July 23 2019 16:59:43 EDT Ventricular Rate:  78 PR Interval:  142 QRS Duration: 88 QT Interval:  392 QTC Calculation: 446 R Axis:   44 Text Interpretation:  Normal sinus rhythm Cannot rule out  Anterior infarct , age undetermined Abnormal ECG no significant change since 2015 Confirmed by Pricilla LovelessGoldston, Scott [phone]([id number]) on 07/23/2019 6:52:24 PM   Radiology Dg Chest 2 View  Result Date: 07/23/2019 CLINICAL DATA:  Left chest pain. EXAM: CHEST - 2 VIEW COMPARISON:  December 29, 2017 FINDINGS: Mild atelectasis in the left base. The heart, hila, mediastinum, lungs, and pleura are otherwise unremarkable. IMPRESSION: No active cardiopulmonary disease. Electronically Signed   By: Gerome Samavid  Williams III M.D   On: 07/23/2019 18:23    Procedures Procedures (including critical care time)  Medications Ordered in ED Medications  sodium chloride flush (NS) 0.9 % injection 3 mL (has no administration in time range)  potassium chloride SA (K-DUR) CR tablet 60 mEq (60 mEq Oral Given 07/23/19 2015)  acetaminophen (TYLENOL) tablet 1,000 mg (1,000 mg Oral Given 07/23/19 2015)     Initial Impression / Assessment and Plan / ED Course  I have reviewed the triage vital signs and the nursing notes.  Pertinent labs & imaging results that were available during my care of the patient were reviewed by me and considered in my medical decision making (see chart for details).        53 year old female with past medical history hypertension currently on chlorthalidone presents to ED for left-sided chest pain that began while she was driving.  She pulled over to the side of the road when this occurred today.   Checked her blood pressure and it was read high in the 180 systolic.  Called her PCP, was told to take another dose of blood pressure medication which she did and then came to the ED.  She complains of a headache which has gradually improved.  She has not tried medications to help with her headache or chest pain, states that it is typical for her to get headaches for which she will sometimes take Goody powders for.  On my exam patient is overall well appearing. No deficits to neurological exam noted.  No lower extremity edema, erythema or calf tenderness I would concern me for DVT.  She is low risk by Wells criteria.  She is not tachycardic, tachypneic or hypoxic.  EKG shows sinus rhythm, no changes from prior tracings.  Chest x-ray is unremarkable.  Lab work significant for hypokalemia of 2.9.  Seems that she has a history of this but was never sent home with any potassium supplementation at her visit earlier in the year.  Potassium repleted orally here.  Initial troponin is negative.  Delta troponin is negative as well.  Patient given Tylenol with improvement in her symptoms.  Will discharge home with p.o. potassium, PCP follow-up.  Patient is hemodynamically stable, in NAD, and able to ambulate in the ED. Evaluation does not show pathology that would require ongoing emergent intervention or inpatient treatment. I explained the diagnosis to the patient. Pain has been managed and has no complaints prior to discharge. Patient is comfortable with above plan and is stable for discharge at this time. All questions were answered prior to disposition. Strict return precautions for returning to the ED were discussed. Encouraged follow up with PCP.   An After Visit Summary was printed and given to the patient.   Portions of this note were generated with Scientist, clinical (histocompatibility and immunogenetics)Dragon dictation software. Dictation errors may occur despite best attempts at proofreading.   Final Clinical Impressions(s) / ED Diagnoses   Final diagnoses:   Hypokalemia  Chest wall pain    ED Discharge Orders  Ordered    potassium chloride (K-DUR) 10 MEQ tablet  Daily     07/23/19 2038           Dietrich Pates, PA-C 07/23/19 2039    Pricilla Loveless, MD 07/23/19 2055

## 2019-10-05 ENCOUNTER — Other Ambulatory Visit: Payer: Self-pay

## 2019-10-05 ENCOUNTER — Encounter (HOSPITAL_COMMUNITY): Payer: Self-pay

## 2019-10-05 ENCOUNTER — Emergency Department (HOSPITAL_COMMUNITY)
Admission: EM | Admit: 2019-10-05 | Discharge: 2019-10-05 | Discharge status: Against Medical Advice | Payer: BC Managed Care – PPO | Attending: Emergency Medicine | Admitting: Emergency Medicine

## 2019-10-05 DIAGNOSIS — Z5321 Procedure and treatment not carried out due to patient leaving prior to being seen by health care provider: Secondary | ICD-10-CM | POA: Diagnosis not present

## 2019-10-05 DIAGNOSIS — K625 Hemorrhage of anus and rectum: Secondary | ICD-10-CM | POA: Diagnosis present

## 2019-10-05 LAB — CBC
HCT: 45.4 % (ref 36.0–46.0)
Hemoglobin: 14.3 g/dL (ref 12.0–15.0)
MCH: 27.9 pg (ref 26.0–34.0)
MCHC: 31.5 g/dL (ref 30.0–36.0)
MCV: 88.7 fL (ref 80.0–100.0)
Platelets: 262 10*3/uL (ref 150–400)
RBC: 5.12 MIL/uL — ABNORMAL HIGH (ref 3.87–5.11)
RDW: 14.4 % (ref 11.5–15.5)
WBC: 9.7 10*3/uL (ref 4.0–10.5)
nRBC: 0 % (ref 0.0–0.2)

## 2019-10-05 LAB — COMPREHENSIVE METABOLIC PANEL
ALT: 16 U/L (ref 0–44)
AST: 14 U/L — ABNORMAL LOW (ref 15–41)
Albumin: 3.4 g/dL — ABNORMAL LOW (ref 3.5–5.0)
Alkaline Phosphatase: 83 U/L (ref 38–126)
Anion gap: 12 (ref 5–15)
BUN: 12 mg/dL (ref 6–20)
CO2: 29 mmol/L (ref 22–32)
Calcium: 8.9 mg/dL (ref 8.9–10.3)
Chloride: 97 mmol/L — ABNORMAL LOW (ref 98–111)
Creatinine, Ser: 0.84 mg/dL (ref 0.44–1.00)
GFR calc Af Amer: >60 mL/min (ref >60)
GFR calc non Af Amer: >60 mL/min (ref >60)
Glucose, Bld: 99 mg/dL (ref 70–99)
Potassium: 3.6 mmol/L (ref 3.5–5.1)
Sodium: 138 mmol/L (ref 135–145)
Total Bilirubin: 0.6 mg/dL (ref 0.3–1.2)
Total Protein: 7 g/dL (ref 6.5–8.1)

## 2019-10-05 LAB — TYPE AND SCREEN
ABO/RH(D): O POS
Antibody Screen: NEGATIVE

## 2019-10-05 LAB — ABO/RH: ABO/RH(D): O POS

## 2019-10-05 LAB — I-STAT BETA HCG BLOOD, ED (MC, WL, AP ONLY): I-stat hCG, quantitative: <5 m[IU]/mL (ref <5)

## 2019-10-05 NOTE — ED Notes (Signed)
Called pt x4 no answer 

## 2019-10-05 NOTE — ED Triage Notes (Signed)
Pt presents w/diarrhea, fever, nausea x5 days. some bloody stool starting today. Seen at [facility] for the same. Recent covid test negative

## 2019-11-21 ENCOUNTER — Ambulatory Visit
Admission: EM | Admit: 2019-11-21 | Discharge: 2019-11-21 | Disposition: A | Discharge status: Home / Self Care | Payer: BC Managed Care – PPO

## 2019-11-21 ENCOUNTER — Other Ambulatory Visit: Payer: Self-pay

## 2019-11-21 DIAGNOSIS — R062 Wheezing: Secondary | ICD-10-CM | POA: Diagnosis not present

## 2019-11-21 DIAGNOSIS — R059 Cough, unspecified: Secondary | ICD-10-CM

## 2019-11-21 DIAGNOSIS — Z20828 Contact with and (suspected) exposure to other viral communicable diseases: Secondary | ICD-10-CM | POA: Diagnosis not present

## 2019-11-21 DIAGNOSIS — R05 Cough: Secondary | ICD-10-CM | POA: Diagnosis not present

## 2019-11-21 HISTORY — DX: Pneumonia, unspecified organism: J18.9

## 2019-11-21 LAB — POC SARS CORONAVIRUS 2 AG -  ED: SARS Coronavirus 2 Ag: NEGATIVE

## 2019-11-21 MED ORDER — AZITHROMYCIN 250 MG PO TABS: 250.0000 mg | ORAL_TABLET | Freq: Every day | ORAL | 0 refills | Status: DC

## 2019-11-21 MED ORDER — METHYLPREDNISOLONE SODIUM SUCC 125 MG IJ SOLR
125.0000 mg | Freq: Once | INTRAMUSCULAR | Status: AC
  Administered 2019-11-21: 125 mg via INTRAMUSCULAR

## 2019-11-21 MED ORDER — ALBUTEROL SULFATE HFA 108 (90 BASE) MCG/ACT IN AERS: 2.0000 | INHALATION_SPRAY | RESPIRATORY_TRACT | 0 refills | Status: DC | PRN

## 2019-11-21 NOTE — Discharge Instructions (Signed)
Antibiotic as prescribed. Important to continue inhaler use as needed. Return for worsening cough, shortness of breath, chest pain.  Your COVID test is pending - it is important to quarantine / isolate at home until your results are back. If you test positive and would like further evaluation for persistent or worsening symptoms, you may schedule an E-visit or virtual (video) visit throughout the [facility] app or website.  PLEASE NOTE: If you develop severe chest pain or shortness of breath please go to the ER or call 9-1-1 for further evaluation --> DO NOT schedule electronic or virtual visits for this. Please call our office for further guidance / recommendations as needed.

## 2019-11-21 NOTE — ED Triage Notes (Signed)
Pt presents to UC w/ c/o wheezing, cough, congestion, sob x3 days. Pt has hx of pneumonia/bronchitis

## 2019-11-21 NOTE — ED Provider Notes (Signed)
[facility]    CSN: [id number] Arrival date & time: 11/21/19  1355      History   Chief Complaint Chief Complaint  Patient presents with  . APPT 1400 wheezing/cough    HPI Robin Castaneda is a 53 y.o. female with history of BPD, hypertension presenting for 3-day course of increased cough, sputum production, wheezing.  Denies hemoptysis, shortness of breath, fever, known sick contact.  Patient has history of allergies, asthma, eczema.  Has used her home albuterol every 4-6 hours with adequate relief of wheezing.  Patient does endorse history of both pneumonia and bronchitis.  States that this feels similar to bronchitis: Typically resolves with "an antibiotic and a steroid shot because I cannot tolerate the pill one".   Past Medical History:  Diagnosis Date  . Bipolar 1 disorder ([facility])   . Depression   . Hypertension   . Pneumonia     There are no problems to display for this patient.   Past Surgical History:  Procedure Laterality Date  . JOINT REPLACEMENT    . TUBAL LIGATION      OB History   No obstetric history on file.      Home Medications    Prior to Admission medications   Medication Sig Start Date End Date Taking? Authorizing Provider  chlorthalidone (HYGROTON) 25 MG tablet Take 25 mg by mouth daily.   Yes [provider]  lamoTRIgine (LAMICTAL) 100 MG tablet Take 300 mg by mouth daily.   Yes [provider]  omeprazole (PRILOSEC) 20 MG capsule Take 20 mg by mouth daily.   Yes [provider]  albuterol (VENTOLIN HFA) 108 (90 Base) MCG/ACT inhaler Inhale 2 puffs into the lungs every 4 (four) hours as needed for wheezing or shortness of breath. 11/21/19   Hall-Potvin, [place], PA-C  azithromycin (ZITHROMAX) 250 MG tablet Take 1 tablet (250 mg total) by mouth daily. 1 tab every day until finished. 11/21/19   Hall-Potvin, [place], PA-C  buPROPion (WELLBUTRIN XL) 300 MG 24 hr tablet Take 300 mg by mouth daily.  09/02/14    [provider]  escitalopram (LEXAPRO) 20 MG tablet Take 40 mg by mouth daily.  09/02/14   [provider]  LORazepam (ATIVAN) 1 MG tablet Take 1 mg by mouth daily as needed for anxiety (severe agitation).  09/12/14   [provider]  naproxen sodium (ALEVE) 220 MG tablet Take 220 mg by mouth daily as needed (pain).    [provider]  potassium chloride (K-DUR) 10 MEQ tablet Take 3 tablets (30 mEq total) by mouth daily for 4 days. Patient taking differently: Take 20 mEq by mouth daily.  07/23/19 07/27/19  Khatri, Hina, PA-C  triamcinolone ointment (KENALOG) 0.5 % Apply 1 application topically daily as needed (rash).  09/12/14   [provider]    Family History Family History  Problem Relation Age of Onset  . Healthy Mother   . Healthy Father     Social History Social History   Tobacco Use  . Smoking status: Never Smoker  . Smokeless tobacco: Never Used  Substance Use Topics  . Alcohol use: No  . Drug use: No     Allergies   Latex and Latex   Review of Systems Review of Systems  Constitutional: Negative for activity change, appetite change, fatigue and fever.  HENT: Negative for ear pain, sinus pain, sore throat and voice change.   Eyes: Negative for pain, redness and visual disturbance.  Respiratory: Positive  for cough and wheezing. Negative for apnea, choking, chest tightness, shortness of breath and stridor.   Cardiovascular: Negative for chest pain and palpitations.  Gastrointestinal: Negative for abdominal pain, diarrhea and vomiting.  Musculoskeletal: Negative for arthralgias and myalgias.  Skin: Negative for rash and wound.  Neurological: Negative for syncope and headaches.    Physical Exam Triage Vital Signs ED Triage Vitals  Enc Vitals Group     BP 11/21/19 1438 122/85     Pulse Rate 11/21/19 1438 82     Resp 11/21/19 1438 20     Temp 11/21/19 1438 98.2 F (36.8 C)     Temp Source 11/21/19 1433 Oral     SpO2  11/21/19 1438 94 %     Weight --      Height --      Head Circumference --      Peak Flow --      Pain Score 11/21/19 1434 0     Pain Loc --      Pain Edu? --      Excl. in GC? --    No data found.  Updated Vital Signs BP 122/85 (BP Location: Left Arm)   Pulse 82   Temp 98.2 F (36.8 C) (Oral)   Resp 20   SpO2 94%   Visual Acuity Right Eye Distance:   Left Eye Distance:   Bilateral Distance:    Right Eye Near:   Left Eye Near:    Bilateral Near:     Physical Exam Constitutional:      General: She is not in acute distress.    Appearance: She is not ill-appearing or diaphoretic.  HENT:     Head: Normocephalic and atraumatic.     Nose: Nose normal.     Mouth/Throat:     Mouth: Mucous membranes are moist.     Pharynx: Oropharynx is clear.  Eyes:     General: No scleral icterus.    Conjunctiva/sclera: Conjunctivae normal.     Pupils: Pupils are equal, round, and reactive to light.  Cardiovascular:     Rate and Rhythm: Normal rate and regular rhythm.  Pulmonary:     Effort: Pulmonary effort is normal. No respiratory distress.     Breath sounds: No stridor. Wheezing present. No rhonchi or rales.  Chest:     Chest wall: No tenderness.  Musculoskeletal:        General: Normal range of motion.     Cervical back: Neck supple. No tenderness.     Right lower leg: No edema.     Left lower leg: No edema.  Lymphadenopathy:     Cervical: No cervical adenopathy.  Skin:    Capillary Refill: Capillary refill takes less than 2 seconds.     Coloration: Skin is not jaundiced or pale.  Neurological:     General: No focal deficit present.     Mental Status: She is alert and oriented to person, place, and time.  Psychiatric:        Mood and Affect: Mood normal.        Behavior: Behavior normal.      UC Treatments / Results  Labs (all labs ordered are listed, but only abnormal results are displayed) Labs Reviewed  NOVEL CORONAVIRUS, NAA  POC SARS CORONAVIRUS 2 AG -  ED     EKG   Radiology No results found.  Procedures Procedures (including critical care time)  Medications Ordered in UC Medications  methylPREDNISolone sodium succinate (SOLU-MEDROL) 125 mg/2  mL injection 125 mg (125 mg Intramuscular Given 11/21/19 1550)    Initial Impression / Assessment and Plan / UC Course  I have reviewed the triage vital signs and the nursing notes.  Pertinent labs & imaging results that were available during my care of the patient were reviewed by me and considered in my medical decision making (see chart for details).     Patient afebrile, nontoxic, with SpO2 94%.  Rapid Covid in office, reviewed by me: Negative-Covid PCR pending.  Patient to quarantine until results are back.  We will start Z-Pak, refill albuterol inhaler for likely bronchitis given patient's history and increase sputum production.  Patient given IM Solu-Medrol in office which he tolerated well.  Oral prednisone deferred as patient reports ADRs of irritability, increased appetite.  Return precautions discussed, patient verbalized understanding and is agreeable to plan. Final Clinical Impressions(s) / UC Diagnoses   Final diagnoses:  Cough     Discharge Instructions     Antibiotic as prescribed. Important to continue inhaler use as needed. Return for worsening cough, shortness of breath, chest pain.  Your COVID test is pending - it is important to quarantine / isolate at home until your results are back. If you test positive and would like further evaluation for persistent or worsening symptoms, you may schedule an E-visit or virtual (video) visit throughout the [facility] app or website.  PLEASE NOTE: If you develop severe chest pain or shortness of breath please go to the ER or call 9-1-1 for further evaluation --> DO NOT schedule electronic or virtual visits for this. Please call our office for further guidance / recommendations as needed.    ED Prescriptions     Medication Sig Dispense Auth. Provider   azithromycin (ZITHROMAX) 250 MG tablet Take 1 tablet (250 mg total) by mouth daily. 1 tab every day until finished. 4 tablet Hall-Potvin, [place], PA-C   albuterol (VENTOLIN HFA) 108 (90 Base) MCG/ACT inhaler Inhale 2 puffs into the lungs every 4 (four) hours as needed for wheezing or shortness of breath. 18 g Hall-Potvin, [place], PA-C     PDMP not reviewed this encounter.   Hall-Potvin, [place], [place] 11/21/19 1712

## 2019-11-22 LAB — NOVEL CORONAVIRUS, NAA: SARS-CoV-2, NAA: NOT DETECTED

## 2020-06-29 DIAGNOSIS — M79641 Pain in right hand: Secondary | ICD-10-CM | POA: Insufficient documentation

## 2020-09-04 DIAGNOSIS — M65311 Trigger thumb, right thumb: Secondary | ICD-10-CM | POA: Insufficient documentation

## 2020-11-18 ENCOUNTER — Emergency Department (HOSPITAL_COMMUNITY): Payer: Self-pay

## 2020-11-18 ENCOUNTER — Encounter (HOSPITAL_COMMUNITY): Payer: Self-pay

## 2020-11-18 ENCOUNTER — Emergency Department (HOSPITAL_COMMUNITY)
Admission: EM | Admit: 2020-11-18 | Discharge: 2020-11-18 | Disposition: A | Discharge status: Home / Self Care | Payer: Self-pay | Attending: Emergency Medicine | Admitting: Emergency Medicine

## 2020-11-18 ENCOUNTER — Other Ambulatory Visit: Payer: Self-pay

## 2020-11-18 ENCOUNTER — Emergency Department (HOSPITAL_BASED_OUTPATIENT_CLINIC_OR_DEPARTMENT_OTHER): Payer: Self-pay

## 2020-11-18 DIAGNOSIS — Z9660 Presence of unspecified orthopedic joint implant: Secondary | ICD-10-CM | POA: Insufficient documentation

## 2020-11-18 DIAGNOSIS — I10 Essential (primary) hypertension: Secondary | ICD-10-CM | POA: Insufficient documentation

## 2020-11-18 DIAGNOSIS — Z79899 Other long term (current) drug therapy: Secondary | ICD-10-CM | POA: Insufficient documentation

## 2020-11-18 DIAGNOSIS — M79609 Pain in unspecified limb: Secondary | ICD-10-CM

## 2020-11-18 DIAGNOSIS — R600 Localized edema: Secondary | ICD-10-CM | POA: Insufficient documentation

## 2020-11-18 DIAGNOSIS — R200 Anesthesia of skin: Secondary | ICD-10-CM | POA: Insufficient documentation

## 2020-11-18 DIAGNOSIS — Z91040 Latex allergy status: Secondary | ICD-10-CM | POA: Insufficient documentation

## 2020-11-18 DIAGNOSIS — M7989 Other specified soft tissue disorders: Secondary | ICD-10-CM

## 2020-11-18 DIAGNOSIS — M25571 Pain in right ankle and joints of right foot: Secondary | ICD-10-CM | POA: Insufficient documentation

## 2020-11-18 MED ORDER — ACETAMINOPHEN 500 MG PO TABS
1000.0000 mg | ORAL_TABLET | Freq: Once | ORAL | Status: AC
  Administered 2020-11-18: 1000 mg via ORAL
  Filled 2020-11-18: qty 2

## 2020-11-18 MED ORDER — KETOROLAC TROMETHAMINE 60 MG/2ML IM SOLN
15.0000 mg | Freq: Once | INTRAMUSCULAR | Status: AC
  Administered 2020-11-18: 15 mg via INTRAMUSCULAR
  Filled 2020-11-18: qty 2

## 2020-11-18 NOTE — ED Provider Notes (Signed)
[facility] Provider Note   CSN: [id number] Arrival date & time: 11/18/20  1646     History Chief Complaint  Patient presents with  . Leg Pain    Robin Castaneda is a 54 y.o. female.  54 yo F with a chief complaints of right ankle pain.  Going on for the past week.  No obvious injury.  Worse with bearing weight or ambulation.  She has noticed some swelling to the area as well.  Pain is sharp and shoots up to the posterior aspect of the knee.  Felt some numbness to it earlier today as well.  Denies prior injury to the area.  Denies fevers.  Denies break in the skin.  The history is provided by the patient.  Leg Pain Location:  Leg Time since incident:  2 days Injury: no   Leg location:  R lower leg Pain details:    Quality:  Aching   Severity:  Moderate   Onset quality:  Gradual   Duration:  2 days   Timing:  Constant   Progression:  Worsening Chronicity:  New Dislocation: no   Prior injury to area:  No Relieved by:  Nothing Worsened by:  Abduction, activity and bearing weight Ineffective treatments:  None tried Associated symptoms: no fever        Past Medical History:  Diagnosis Date  . Bipolar 1 disorder (HCC)   . Depression   . Hypertension   . Pneumonia     There are no problems to display for this patient.   Past Surgical History:  Procedure Laterality Date  . JOINT REPLACEMENT    . TUBAL LIGATION       OB History   No obstetric history on file.     Family History  Problem Relation Age of Onset  . Healthy Mother   . Healthy Father     Social History   Tobacco Use  . Smoking status: Never Smoker  . Smokeless tobacco: Never Used  Substance Use Topics  . Alcohol use: No  . Drug use: No    Home Medications Prior to Admission medications   Medication Sig Start Date End Date Taking? Authorizing Provider  albuterol (VENTOLIN HFA) 108 (90 Base) MCG/ACT inhaler Inhale 2 puffs into the lungs  every 4 (four) hours as needed for wheezing or shortness of breath. 11/21/19   Hall-Potvin, [place], PA-C  azithromycin (ZITHROMAX) 250 MG tablet Take 1 tablet (250 mg total) by mouth daily. 1 tab every day until finished. 11/21/19   Hall-Potvin, [place], PA-C  buPROPion (WELLBUTRIN XL) 300 MG 24 hr tablet Take 300 mg by mouth daily.  09/02/14   [provider]  chlorthalidone (HYGROTON) 25 MG tablet Take 25 mg by mouth daily.    [provider]  escitalopram (LEXAPRO) 20 MG tablet Take 40 mg by mouth daily.  09/02/14   [provider]  lamoTRIgine (LAMICTAL) 100 MG tablet Take 300 mg by mouth daily.    [provider]  LORazepam (ATIVAN) 1 MG tablet Take 1 mg by mouth daily as needed for anxiety (severe agitation).  09/12/14   [provider]  naproxen sodium (ALEVE) 220 MG tablet Take 220 mg by mouth daily as needed (pain).    [provider]  omeprazole (PRILOSEC) 20 MG capsule Take 20 mg by mouth daily.    [provider]  potassium chloride (K-DUR) 10 MEQ tablet Take 3 tablets (30 mEq total) by mouth daily for  4 days. Patient taking differently: Take 20 mEq by mouth daily.  07/23/19 07/27/19  Khatri, Hina, PA-C  triamcinolone ointment (KENALOG) 0.5 % Apply 1 application topically daily as needed (rash).  09/12/14   [provider]    Allergies    Latex and Latex  Review of Systems   Review of Systems  Constitutional: Negative for chills and fever.  HENT: Negative for congestion and rhinorrhea.   Eyes: Negative for redness and visual disturbance.  Respiratory: Negative for shortness of breath and wheezing.   Cardiovascular: Negative for chest pain and palpitations.  Gastrointestinal: Negative for nausea and vomiting.  Genitourinary: Negative for dysuria and urgency.  Musculoskeletal: Positive for arthralgias and myalgias.  Skin: Negative for pallor and wound.  Neurological: Negative for dizziness and headaches.     Physical Exam Updated Vital Signs BP (!) 142/86   Pulse 78   Temp 98.2 F (36.8 C) (Oral)   Resp 18   SpO2 94%   Physical Exam Vitals and nursing note reviewed.  Constitutional:      General: She is not in acute distress.    Appearance: She is well-developed and well-nourished. She is not diaphoretic.  HENT:     Head: Normocephalic and atraumatic.  Eyes:     Extraocular Movements: EOM normal.     Pupils: Pupils are equal, round, and reactive to light.  Cardiovascular:     Rate and Rhythm: Normal rate and regular rhythm.     Heart sounds: No murmur heard. No friction rub. No gallop.   Pulmonary:     Effort: Pulmonary effort is normal.     Breath sounds: No wheezing or rales.  Abdominal:     General: There is no distension.     Palpations: Abdomen is soft.     Tenderness: There is no abdominal tenderness.  Musculoskeletal:        General: Swelling and tenderness present. No edema.     Cervical back: Normal range of motion and neck supple.     Comments: Right ankle pain and swelling.  Worse to the lateral aspect along the anterior fibular ligament.  No pain along the base of the fifth metatarsal or the navicular bone.  She does have some pain to the plantar surface of the foot along the medial aspect.  Achilles is intact.  Pulse motor and sensation intact distally.  There is no erythema or warmth to the ankle.  No pain at the fibular neck.  Skin:    General: Skin is warm and dry.  Neurological:     Mental Status: She is alert and oriented to person, place, and time.  Psychiatric:        Mood and Affect: Mood and affect normal.        Behavior: Behavior normal.     ED Results / Procedures / Treatments   Labs (all labs ordered are listed, but only abnormal results are displayed) Labs Reviewed - No data to display  EKG None  Radiology DG Ankle Complete Right  Result Date: 11/18/2020 CLINICAL DATA:  Right calf pain and swelling.  No trauma. EXAM: RIGHT ANKLE -  COMPLETE 3+ VIEW COMPARISON:  12/29/2017 from [place] FINDINGS: Diffuse soft tissue swelling. No acute fracture or dislocation. Moderate osteoarthritis, with tibiotalar joint space narrowing and subchondral sclerosis. Small Achilles and calcaneal spurs. Base of fifth metatarsal and talar dome intact. IMPRESSION: Soft tissue swelling and degenerative change. No acute osseous abnormality. Electronically Signed   By: Hosie Spangle.D.  On: 11/18/2020 18:14   VAS [place] LOWER EXTREMITY VENOUS (DVT) (ONLY [facility] & [facility])  Result Date: 11/18/2020  Lower Venous DVT Study Indications: Pain, and Swelling.  Comparison Study: no prior Performing Technologist: Blanch Media RVS  Examination Guidelines: A complete evaluation includes B-mode imaging, spectral Doppler, color Doppler, and power Doppler as needed of all accessible portions of each vessel. Bilateral testing is considered an integral part of a complete examination. Limited examinations for reoccurring indications may be performed as noted. The reflux portion of the exam is performed with the patient in reverse Trendelenburg.  +---------+---------------+---------+-----------+----------+--------------+ RIGHT    CompressibilityPhasicitySpontaneityPropertiesThrombus Aging +---------+---------------+---------+-----------+----------+--------------+ CFV      Full           Yes      Yes                                 +---------+---------------+---------+-----------+----------+--------------+ SFJ      Full                                                        +---------+---------------+---------+-----------+----------+--------------+ FV Prox  Full                                                        +---------+---------------+---------+-----------+----------+--------------+ FV Mid   Full                                                        +---------+---------------+---------+-----------+----------+--------------+ FV DistalFull                                                         +---------+---------------+---------+-----------+----------+--------------+ PFV      Full                                                        +---------+---------------+---------+-----------+----------+--------------+ POP      Full           Yes      Yes                                 +---------+---------------+---------+-----------+----------+--------------+ PTV      Full                                                        +---------+---------------+---------+-----------+----------+--------------+ PERO     Full                                                        +---------+---------------+---------+-----------+----------+--------------+   +----+---------------+---------+-----------+----------+--------------+  LEFTCompressibilityPhasicitySpontaneityPropertiesThrombus Aging +----+---------------+---------+-----------+----------+--------------+ CFV Full           Yes      Yes                                 +----+---------------+---------+-----------+----------+--------------+     Summary: RIGHT: - There is no evidence of deep vein thrombosis in the lower extremity.  - No cystic structure found in the popliteal fossa.  LEFT: - No evidence of common femoral vein obstruction.  *See table(s) above for measurements and observations.    Preliminary     Procedures Procedures (including critical care time)  Medications Ordered in ED Medications  acetaminophen (TYLENOL) tablet 1,000 mg (1,000 mg Oral Given 11/18/20 1813)  ketorolac (TORADOL) injection 15 mg (15 mg Intramuscular Given 11/18/20 1812)    ED Course  I have reviewed the triage vital signs and the nursing notes.  Pertinent labs & imaging results that were available during my care of the patient were reviewed by me and considered in my medical decision making (see chart for details).    MDM Rules/Calculators/A&P                          54 yo F  with a chief complaint of right ankle pain.  Nontraumatic.  Clinically does not appear to be septic.  DVT study was ordered in triage is negative.  Plain film of the right ankle ordered with pain with bearing weight and pain along the lateral malleolus.  Viewed by me without fracture.  Will place in an ASO.  Crutches.  PCP follow-up.  6:31 PM:  I have discussed the diagnosis/risks/treatment options with the patient and believe the pt to be eligible for discharge home to follow-up with PCP. We also discussed returning to the ED immediately if new or worsening sx occur. We discussed the sx which are most concerning (e.g., sudden worsening pain, fever, inability to tolerate by mouth) that necessitate immediate return. Medications administered to the patient during their visit and any new prescriptions provided to the patient are listed below.  Medications given during this visit Medications  acetaminophen (TYLENOL) tablet 1,000 mg (1,000 mg Oral Given 11/18/20 1813)  ketorolac (TORADOL) injection 15 mg (15 mg Intramuscular Given 11/18/20 1812)     The patient appears reasonably screen and/or stabilized for discharge and I doubt any other medical condition or other [facility] requiring further screening, evaluation, or treatment in the ED at this time prior to discharge.   Final Clinical Impression(s) / ED Diagnoses Final diagnoses:  Acute right ankle pain    Rx / DC Orders ED Discharge Orders    None       Melene Plan, DO 11/18/20 1831

## 2020-11-18 NOTE — ED Triage Notes (Signed)
Pt reports 1 week of right calf pain with associated swelling. Denies any injury or trauma.

## 2020-11-18 NOTE — Progress Notes (Signed)
Lower extremity venous has been completed.   Preliminary results in CV Proc.   Blanch Media 11/18/2020 5:36 PM

## 2020-11-18 NOTE — Discharge Instructions (Signed)
Return for redness, fever.  Keep your weight off of it.  Elevate it when you can.   Take 4 over the counter ibuprofen tablets 3 times a day or 2 over-the-counter naproxen tablets twice a day for pain. Also take tylenol 1000mg (2 extra strength) four times a day.

## 2021-06-13 ENCOUNTER — Ambulatory Visit
Admission: RE | Admit: 2021-06-13 | Discharge: 2021-06-13 | Disposition: A | Discharge status: Home / Self Care | Payer: PRIVATE HEALTH INSURANCE | Source: Ambulatory Visit | Attending: Family Medicine | Admitting: Family Medicine

## 2021-06-13 ENCOUNTER — Other Ambulatory Visit: Payer: Self-pay

## 2021-06-13 VITALS — BP 149/103 | HR 81 | Temp 98.1°F | Resp 20

## 2021-06-13 DIAGNOSIS — M25531 Pain in right wrist: Secondary | ICD-10-CM

## 2021-06-13 MED ORDER — NAPROXEN 500 MG PO TABS: 500.0000 mg | ORAL_TABLET | Freq: Two times a day (BID) | ORAL | 0 refills | Status: DC | PRN

## 2021-06-13 MED ORDER — CYCLOBENZAPRINE HCL 10 MG PO TABS: 5.0000 mg | ORAL_TABLET | Freq: Three times a day (TID) | ORAL | 0 refills | Status: DC | PRN

## 2021-06-13 NOTE — ED Triage Notes (Signed)
Pt presents with right wrist pain that developed yesterday, denies injury , swelling noted

## 2021-06-13 NOTE — ED Provider Notes (Signed)
[facility]    CSN: [id number] Arrival date & time: 06/13/21  1255      History   Chief Complaint Chief Complaint  Patient presents with   Wrist Pain   HPI Robin Castaneda is a 55 y.o. female.   Patient presenting today with 1 day history of right ulnar wrist pain, mild swelling that started yesterday.  She denies any known injury or change in activity or exercise.  Denies discoloration, numbness, tingling, weakness, history of wrist injuries but states she has had carpal tunnel in this wrist before.  So far not trying anything over-the-counter for symptoms.   Past Medical History:  Diagnosis Date   Bipolar 1 disorder (HCC)    Depression    Hypertension    Pneumonia    There are no problems to display for this patient.  Past Surgical History:  Procedure Laterality Date   JOINT REPLACEMENT     TUBAL LIGATION     OB History   No obstetric history on file.    Home Medications    Prior to Admission medications   Medication Sig Start Date End Date Taking? Authorizing Provider  cyclobenzaprine (FLEXERIL) 10 MG tablet Take 0.5-1 tablets (5-10 mg total) by mouth 3 (three) times daily as needed for muscle spasms. Do not drink alcohol or drive while taking this medication.  May cause drowsiness. 06/13/21  Yes Particia Nearing, PA-C  naproxen (NAPROSYN) 500 MG tablet Take 1 tablet (500 mg total) by mouth 2 (two) times daily as needed. 06/13/21  Yes Particia Nearing, PA-C  albuterol (VENTOLIN HFA) 108 (90 Base) MCG/ACT inhaler Inhale 2 puffs into the lungs every 4 (four) hours as needed for wheezing or shortness of breath. 11/21/19   Hall-Potvin, [place], PA-C  azithromycin (ZITHROMAX) 250 MG tablet Take 1 tablet (250 mg total) by mouth daily. 1 tab every day until finished. 11/21/19   Hall-Potvin, [place], PA-C  buPROPion (WELLBUTRIN XL) 300 MG 24 hr tablet Take 300 mg by mouth daily.  09/02/14   [provider]  chlorthalidone (HYGROTON) 25  MG tablet Take 25 mg by mouth daily.    [provider]  escitalopram (LEXAPRO) 20 MG tablet Take 40 mg by mouth daily.  09/02/14   [provider]  lamoTRIgine (LAMICTAL) 100 MG tablet Take 300 mg by mouth daily.    [provider]  LORazepam (ATIVAN) 1 MG tablet Take 1 mg by mouth daily as needed for anxiety (severe agitation).  09/12/14   [provider]  naproxen sodium (ALEVE) 220 MG tablet Take 220 mg by mouth daily as needed (pain).    [provider]  omeprazole (PRILOSEC) 20 MG capsule Take 20 mg by mouth daily.    [provider]  potassium chloride (K-DUR) 10 MEQ tablet Take 3 tablets (30 mEq total) by mouth daily for 4 days. Patient taking differently: Take 20 mEq by mouth daily. 07/23/19 07/27/19  Khatri, Hina, PA-C  triamcinolone ointment (KENALOG) 0.5 % Apply 1 application topically daily as needed (rash).  09/12/14   [provider]   Family History Family History  Problem Relation Age of Onset   Healthy Mother    Healthy Father     Social History Social History   Tobacco Use   Smoking status: Never   Smokeless tobacco: Never  Substance Use Topics   Alcohol use: No   Drug use: No    Allergies   Latex and Latex   Review of Systems Review  of Systems Per HPI  Physical Exam Triage Vital Signs ED Triage Vitals  Enc Vitals Group     BP 06/13/21 1332 (!) 149/103     Pulse Rate 06/13/21 1332 81     Resp 06/13/21 1332 20     Temp 06/13/21 1332 98.1 F (36.7 C)     Temp src --      SpO2 06/13/21 1332 96 %     Weight --      Height --      Head Circumference --      Peak Flow --      Pain Score 06/13/21 1329 4     Pain Loc --      Pain Edu? --      Excl. in GC? --    No data found.  Updated Vital Signs BP (!) 149/103   Pulse 81   Temp 98.1 F (36.7 C)   Resp 20   SpO2 96%   Visual Acuity Right Eye Distance:   Left Eye Distance:   Bilateral Distance:    Right Eye Near:   Left Eye  Near:    Bilateral Near:     Physical Exam Vitals and nursing note reviewed.  Constitutional:      Appearance: Normal appearance. She is not ill-appearing.  HENT:     Head: Atraumatic.  Eyes:     Extraocular Movements: Extraocular movements intact.     Conjunctiva/sclera: Conjunctivae normal.  Cardiovascular:     Rate and Rhythm: Normal rate and regular rhythm.     Heart sounds: Normal heart sounds.  Pulmonary:     Effort: Pulmonary effort is normal.     Breath sounds: Normal breath sounds.  Musculoskeletal:        General: Swelling and tenderness present. No deformity or signs of injury. Normal range of motion.     Cervical back: Normal range of motion and neck supple.     Comments: Mild swelling over ulnar aspect of wrist and into fifth carpal region with minimal tenderness to palpation in this area.  Grip strength full and equal bilateral hands, range of motion full and intact bilateral hands and wrist   Skin:    General: Skin is warm and dry.     Findings: No bruising or erythema.  Neurological:     Mental Status: She is alert and oriented to person, place, and time.     Sensory: No sensory deficit.     Motor: No weakness.     Comments: Right upper extremity neurovascularly intact  Psychiatric:        Mood and Affect: Mood normal.        Thought Content: Thought content normal.        Judgment: Judgment normal.   UC Treatments / Results  Labs (all labs ordered are listed, but only abnormal results are displayed) Labs Reviewed - No data to display  EKG   Radiology No results found.  Procedures Procedures (including critical care time)  Medications Ordered in UC Medications - No data to display  Initial Impression / Assessment and Plan / UC Course  I have reviewed the triage vital signs and the nursing notes.  Pertinent labs & imaging results that were available during my care of the patient were reviewed by me and considered in my medical decision making  (see chart for details).     Suspect inflammatory cause, possibly some tendinitis on that side.  We will treat with naproxen, Flexeril, stretches, Epsom  salt soaks, massage.  Can try a wrist brace at least at bedtime to help maintain a neutral position.  Sports med follow-up if worsening or not resolving.  No evidence of a bony deformity or recent injury so deferred imaging today with shared decision making. Final Clinical Impressions(s) / UC Diagnoses   Final diagnoses:  Right wrist pain   Discharge Instructions   None    ED Prescriptions     Medication Sig Dispense Auth. Provider   naproxen (NAPROSYN) 500 MG tablet Take 1 tablet (500 mg total) by mouth 2 (two) times daily as needed. 30 tablet Particia Nearing, [place]   cyclobenzaprine (FLEXERIL) 10 MG tablet Take 0.5-1 tablets (5-10 mg total) by mouth 3 (three) times daily as needed for muscle spasms. Do not drink alcohol or drive while taking this medication.  May cause drowsiness. 15 tablet Particia Nearing, [place]      PDMP not reviewed this encounter.   Particia Nearing, [place] 06/13/21 [phone]

## 2021-07-02 ENCOUNTER — Emergency Department (HOSPITAL_COMMUNITY): Payer: PRIVATE HEALTH INSURANCE

## 2021-07-02 ENCOUNTER — Other Ambulatory Visit: Payer: Self-pay

## 2021-07-02 ENCOUNTER — Encounter (HOSPITAL_COMMUNITY): Payer: Self-pay | Admitting: Emergency Medicine

## 2021-07-02 ENCOUNTER — Emergency Department (HOSPITAL_COMMUNITY)
Admission: EM | Admit: 2021-07-02 | Discharge: 2021-07-02 | Disposition: A | Discharge status: Home / Self Care | Payer: PRIVATE HEALTH INSURANCE | Attending: Emergency Medicine | Admitting: Emergency Medicine

## 2021-07-02 DIAGNOSIS — Z91040 Latex allergy status: Secondary | ICD-10-CM | POA: Insufficient documentation

## 2021-07-02 DIAGNOSIS — Z79899 Other long term (current) drug therapy: Secondary | ICD-10-CM | POA: Insufficient documentation

## 2021-07-02 DIAGNOSIS — U071 COVID-19: Secondary | ICD-10-CM

## 2021-07-02 DIAGNOSIS — I10 Essential (primary) hypertension: Secondary | ICD-10-CM | POA: Insufficient documentation

## 2021-07-02 DIAGNOSIS — R062 Wheezing: Secondary | ICD-10-CM

## 2021-07-02 DIAGNOSIS — R0602 Shortness of breath: Secondary | ICD-10-CM | POA: Diagnosis present

## 2021-07-02 LAB — BASIC METABOLIC PANEL
Anion gap: 9 (ref 5–15)
BUN: 10 mg/dL (ref 6–20)
CO2: 29 mmol/L (ref 22–32)
Calcium: 8.7 mg/dL — ABNORMAL LOW (ref 8.9–10.3)
Chloride: 99 mmol/L (ref 98–111)
Creatinine, Ser: 0.69 mg/dL (ref 0.44–1.00)
GFR, Estimated: >60 mL/min (ref >60)
Glucose, Bld: 103 mg/dL — ABNORMAL HIGH (ref 70–99)
Potassium: 3.9 mmol/L (ref 3.5–5.1)
Sodium: 137 mmol/L (ref 135–145)

## 2021-07-02 LAB — CBC WITH DIFFERENTIAL/PLATELET
Abs Immature Granulocytes: 0.04 10*3/uL (ref 0.00–0.07)
Basophils Absolute: 0.1 10*3/uL (ref 0.0–0.1)
Basophils Relative: 1 %
Eosinophils Absolute: 0.6 10*3/uL — ABNORMAL HIGH (ref 0.0–0.5)
Eosinophils Relative: 6 %
HCT: 43.1 % (ref 36.0–46.0)
Hemoglobin: 13.6 g/dL (ref 12.0–15.0)
Immature Granulocytes: 0 %
Lymphocytes Relative: 10 %
Lymphs Abs: 1 10*3/uL (ref 0.7–4.0)
MCH: 27.5 pg (ref 26.0–34.0)
MCHC: 31.6 g/dL (ref 30.0–36.0)
MCV: 87.2 fL (ref 80.0–100.0)
Monocytes Absolute: 0.6 10*3/uL (ref 0.1–1.0)
Monocytes Relative: 6 %
Neutro Abs: 8.6 10*3/uL — ABNORMAL HIGH (ref 1.7–7.7)
Neutrophils Relative %: 77 %
Platelets: 238 10*3/uL (ref 150–400)
RBC: 4.94 MIL/uL (ref 3.87–5.11)
RDW: 15.3 % (ref 11.5–15.5)
WBC: 11 10*3/uL — ABNORMAL HIGH (ref 4.0–10.5)
nRBC: 0 % (ref 0.0–0.2)

## 2021-07-02 LAB — TROPONIN I (HIGH SENSITIVITY)
Troponin I (High Sensitivity): 4 ng/L (ref <18)
Troponin I (High Sensitivity): 5 ng/L (ref <18)

## 2021-07-02 MED ORDER — ALBUTEROL SULFATE HFA 108 (90 BASE) MCG/ACT IN AERS
2.0000 | INHALATION_SPRAY | RESPIRATORY_TRACT | Status: DC | PRN
  Administered 2021-07-02: 2 via RESPIRATORY_TRACT
  Filled 2021-07-02: qty 6.7

## 2021-07-02 MED ORDER — NIRMATRELVIR/RITONAVIR (PAXLOVID)TABLET: 3.0000 | ORAL_TABLET | Freq: Two times a day (BID) | ORAL | 0 refills | Status: AC

## 2021-07-02 MED ORDER — DEXAMETHASONE SODIUM PHOSPHATE 10 MG/ML IJ SOLN
10.0000 mg | Freq: Once | INTRAMUSCULAR | Status: AC
  Administered 2021-07-02: 10 mg via INTRAMUSCULAR
  Filled 2021-07-02: qty 1

## 2021-07-02 NOTE — ED Triage Notes (Signed)
Pt c/o shortness of breath and central chest tightness. Pt tested positive for COVID yesterday. Pt appears in NAD. Respirations even, unlabored. Pt states worsening SOB and chest tightness with exertion.

## 2021-07-02 NOTE — ED Provider Notes (Signed)
[facility] EMERGENCY DEPARTMENT Provider Note   CSN: [id number] Arrival date & time: 07/02/21  1420     History Chief Complaint  Patient presents with   Shortness of Breath    COVID +    Robin Castaneda is a 55 y.o. female.   Shortness of Breath Associated symptoms: chest pain   Associated symptoms: no abdominal pain and no rash   Patient presents with shortness of breath cough and chest tightness.  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  States she began to feel little bad yesterday and took a COVID test and it was positive.  Is vaccinated for times.  Has not had COVID before.  States she felt more short of breath at home.  Checked a pulse ox after walking around it went down to 85.  States she is feeling better now and does feel not feel all that short of breath.  History of possibly asthma.  States she has had an inhaler at home but mostly just wheezes when she gets infections.  States that her PCP think she has asthma however.  Does have some slight chest tightness.  Mild sputum production.  No abdominal pain.  Does have myalgias and fatigue    Past Medical History:  Diagnosis Date   Bipolar 1 disorder (HCC)    Depression    Hypertension    Pneumonia     There are no problems to display for this patient.   Past Surgical History:  Procedure Laterality Date   JOINT REPLACEMENT     TUBAL LIGATION       OB History   No obstetric history on file.     Family History  Problem Relation Age of Onset   Healthy Mother    Healthy Father     Social History   Tobacco Use   Smoking status: Never   Smokeless tobacco: Never  Substance Use Topics   Alcohol use: No   Drug use: No    Home Medications Prior to Admission medications   Medication Sig Start Date End Date Taking? Authorizing Provider  nirmatrelvir/ritonavir EUA (PAXLOVID) TABS Take 3 tablets by mouth 2 (two) times daily for 5 days. Patient GFR is 60. Take nirmatrelvir (150 mg) two tablets  twice daily for 5 days and ritonavir (100 mg) one tablet twice daily for 5 days. 07/02/21 07/07/21 Yes Benjiman Core, MD  albuterol (VENTOLIN HFA) 108 (90 Base) MCG/ACT inhaler Inhale 2 puffs into the lungs every 4 (four) hours as needed for wheezing or shortness of breath. 11/21/19   Hall-Potvin, [place], PA-C  azithromycin (ZITHROMAX) 250 MG tablet Take 1 tablet (250 mg total) by mouth daily. 1 tab every day until finished. 11/21/19   Hall-Potvin, [place], PA-C  buPROPion (WELLBUTRIN XL) 300 MG 24 hr tablet Take 300 mg by mouth daily.  09/02/14   [provider]  chlorthalidone (HYGROTON) 25 MG tablet Take 25 mg by mouth daily.    [provider]  cyclobenzaprine (FLEXERIL) 10 MG tablet Take 0.5-1 tablets (5-10 mg total) by mouth 3 (three) times daily as needed for muscle spasms. Do not drink alcohol or drive while taking this medication.  May cause drowsiness. 06/13/21   Particia Nearing, PA-C  escitalopram (LEXAPRO) 20 MG tablet Take 40 mg by mouth daily.  09/02/14   [provider]  lamoTRIgine (LAMICTAL) 100 MG tablet Take 300 mg by mouth daily.    [provider]  LORazepam (ATIVAN) 1 MG tablet Take 1  mg by mouth daily as needed for anxiety (severe agitation).  09/12/14   [provider]  naproxen (NAPROSYN) 500 MG tablet Take 1 tablet (500 mg total) by mouth 2 (two) times daily as needed. 06/13/21   Particia Nearing, PA-C  naproxen sodium (ALEVE) 220 MG tablet Take 220 mg by mouth daily as needed (pain).    [provider]  omeprazole (PRILOSEC) 20 MG capsule Take 20 mg by mouth daily.    [provider]  potassium chloride (K-DUR) 10 MEQ tablet Take 3 tablets (30 mEq total) by mouth daily for 4 days. Patient taking differently: Take 20 mEq by mouth daily. 07/23/19 07/27/19  Khatri, Hina, PA-C  triamcinolone ointment (KENALOG) 0.5 % Apply 1 application topically daily as needed (rash).  09/12/14   [provider]     Allergies    Latex and Latex  Review of Systems   Review of Systems  Constitutional:  Positive for fatigue. Negative for appetite change.  HENT:  Negative for congestion.   Respiratory:  Positive for shortness of breath.   Cardiovascular:  Positive for chest pain.  Gastrointestinal:  Negative for abdominal pain.  Genitourinary:  Negative for frequency.  Musculoskeletal:  Positive for myalgias.  Skin:  Negative for rash.  Neurological:  Negative for weakness.  Psychiatric/Behavioral:  Negative for confusion.    Physical Exam Updated Vital Signs BP (!) 171/90   Pulse 78   Temp 99 F (37.2 C) (Oral)   Resp 18   Ht 5\' 8"  (1.727 m)   Wt 122.5 kg   SpO2 94%   BMI 41.05 kg/m   Physical Exam Vitals and nursing note reviewed.  Constitutional:      Appearance: She is obese.  HENT:     Head: Atraumatic.  Cardiovascular:     Rate and Rhythm: Normal rate and regular rhythm.  Pulmonary:     Breath sounds: Wheezing present.     Comments: Mild diffuse wheezes.  No focal rales or rhonchi. Chest:     Chest wall: No tenderness.  Musculoskeletal:     Cervical back: Neck supple.     Right lower leg: No edema.     Left lower leg: No edema.  Skin:    General: Skin is warm.     Capillary Refill: Capillary refill takes less than 2 seconds.  Neurological:     Mental Status: She is alert and oriented to person, place, and time.    ED Results / Procedures / Treatments   Labs (all labs ordered are listed, but only abnormal results are displayed) Labs Reviewed  BASIC METABOLIC PANEL - Abnormal; Notable for the following components:      Result Value   Glucose, Bld 103 (*)    Calcium 8.7 (*)    All other components within normal limits  CBC WITH DIFFERENTIAL/PLATELET - Abnormal; Notable for the following components:   WBC 11.0 (*)    Neutro Abs 8.6 (*)    Eosinophils Absolute 0.6 (*)    All other components within normal limits  TROPONIN I (HIGH SENSITIVITY)  TROPONIN I  (HIGH SENSITIVITY)    EKG EKG Interpretation  Date/Time:  Monday July 02 2021 15:43:48 EDT Ventricular Rate:  85 PR Interval:  130 QRS Duration: 90 QT Interval:  394 QTC Calculation: 468 R Axis:   18 Text Interpretation: Normal sinus rhythm Cannot rule out Anterior infarct , age undetermined Abnormal ECG Confirmed by 01-28-1967 [phone]) on 07/02/2021 7:12:22 PM  Radiology DG Chest  2 View  Result Date: 07/02/2021 CLINICAL DATA:  Shortness of breath and central chest tightness. Patient tested positive for COVID yesterday. EXAM: CHEST - 2 VIEW COMPARISON:  Chest radiograph 07/23/2019 and earlier FINDINGS: The heart size and mediastinal contours are within normal limits. Probable trace pleural thickening versus scarring at the left costophrenic angle. The lungs are otherwise clear. No pleural effusion or pneumothorax. The visualized skeletal structures are unremarkable. IMPRESSION: No active cardiopulmonary disease. Stable exam compared to 07/23/2019. Electronically Signed   By: Sherron Ales MD   On: 07/02/2021 16:42    Procedures Procedures   Medications Ordered in ED Medications  albuterol (VENTOLIN HFA) 108 (90 Base) MCG/ACT inhaler 2 puff (has no administration in time range)  dexamethasone (DECADRON) injection 10 mg (has no administration in time range)    ED Course  I have reviewed the triage vital signs and the nursing notes.  Pertinent labs & imaging results that were available during my care of the patient were reviewed by me and considered in my medical decision making (see chart for details).    MDM Rules/Calculators/A&P                           Patient with COVID infection.  Outpatient test positive.  X-ray reassuring here.  Labs reassuring.  Good renal function.  Not hypoxic here.  Does have some wheezing and likely history of asthma.  Will treat with steroids for the asthma.  We will also give inhaler since she states hers has expired.  States she cannot really  take oral steroids because it makes her feel crazy when she is on a longer course of it.  Requests IM steroids.  We will give some Decadron.  Also discussed Paxil of it and will prescribe. Final Clinical Impression(s) / ED Diagnoses Final diagnoses:  Wheezing  COVID-19    Rx / DC Orders ED Discharge Orders          Ordered    nirmatrelvir/ritonavir EUA (PAXLOVID) TABS  2 times daily        07/02/21 1939             Benjiman Core, MD 07/02/21 1942

## 2021-07-02 NOTE — Discharge Instructions (Signed)
Today is day one of your COVID infection.  You can come out of quarantine on Saturday as long as you have not been having fevers.  Then 5 more days of masking.

## 2021-07-02 NOTE — ED Provider Notes (Signed)
Emergency Medicine Provider Triage Evaluation Note  Robin Castaneda , a 55 y.o. female  was evaluated in triage.  Pt complains of worsening shortness of breath and chest pain today in context of COVID-19 infection.  Test positive yesterday..  Review of Systems  Positive: Fevers, chills, chest pain, shortness of breath, productive cough Negative: Nausea, vomiting, diarrhea  Physical Exam  BP (!) 182/103   Pulse 84   Temp 99 F (37.2 C) (Oral)   Resp 20   Ht 5\' 8"  (1.727 m)   Wt 122.5 kg   SpO2 92%   BMI 41.05 kg/m  Gen:   Awake, no distress   Resp:  Normal effort  MSK:   Moves extremities without difficulty  Other:  RRR no M/R/G.  Lung CTA B.  Medical Decision Making  Medically screening exam initiated at 3:53 PM.  Appropriate orders placed.  was informed that the remainder of the evaluation will be completed by another provider, this initial triage assessment does not replace that evaluation, and the importance of remaining in the ED until their evaluation is complete.  This chart was dictated using voice recognition software, Dragon. Despite the best efforts of this provider to proofread and correct errors, errors may still occur which can change documentation meaning.    Dellie Catholic, PA-C 07/02/21 1553    09/01/21, MD 07/02/21 1958

## 2021-07-02 NOTE — ED Notes (Signed)
DC instructions reviewed with pt. PT verbalized understanding. Pt DC °

## 2022-06-11 IMAGING — CR DG CHEST 2V
2 series · 2 of 2 positions shown · non-contrast
Comparison: Chest radiograph 07/23/2019 and earlier

CLINICAL DATA: Shortness of breath and central chest tightness.
Patient tested positive for COVID yesterday.

EXAM:
CHEST - 2 VIEW

[chest pa]
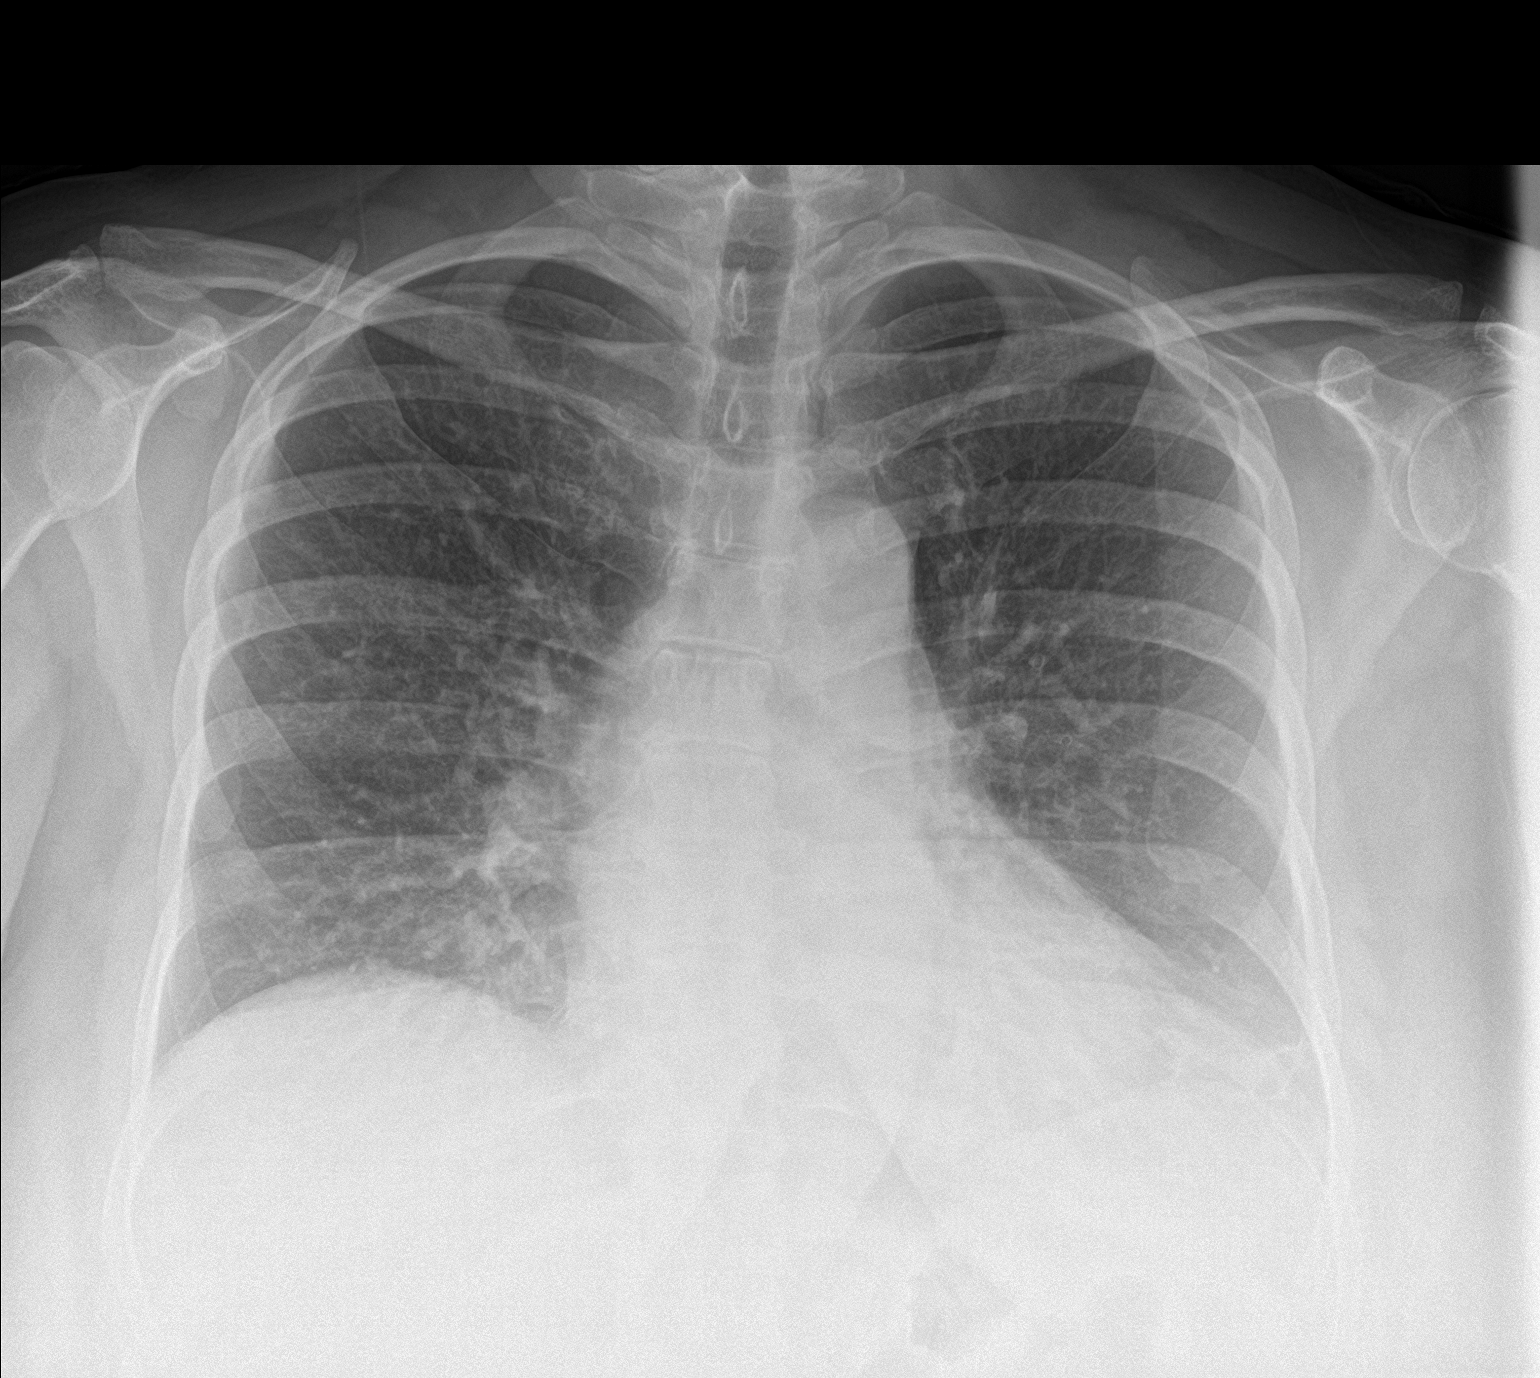

[chest lat]
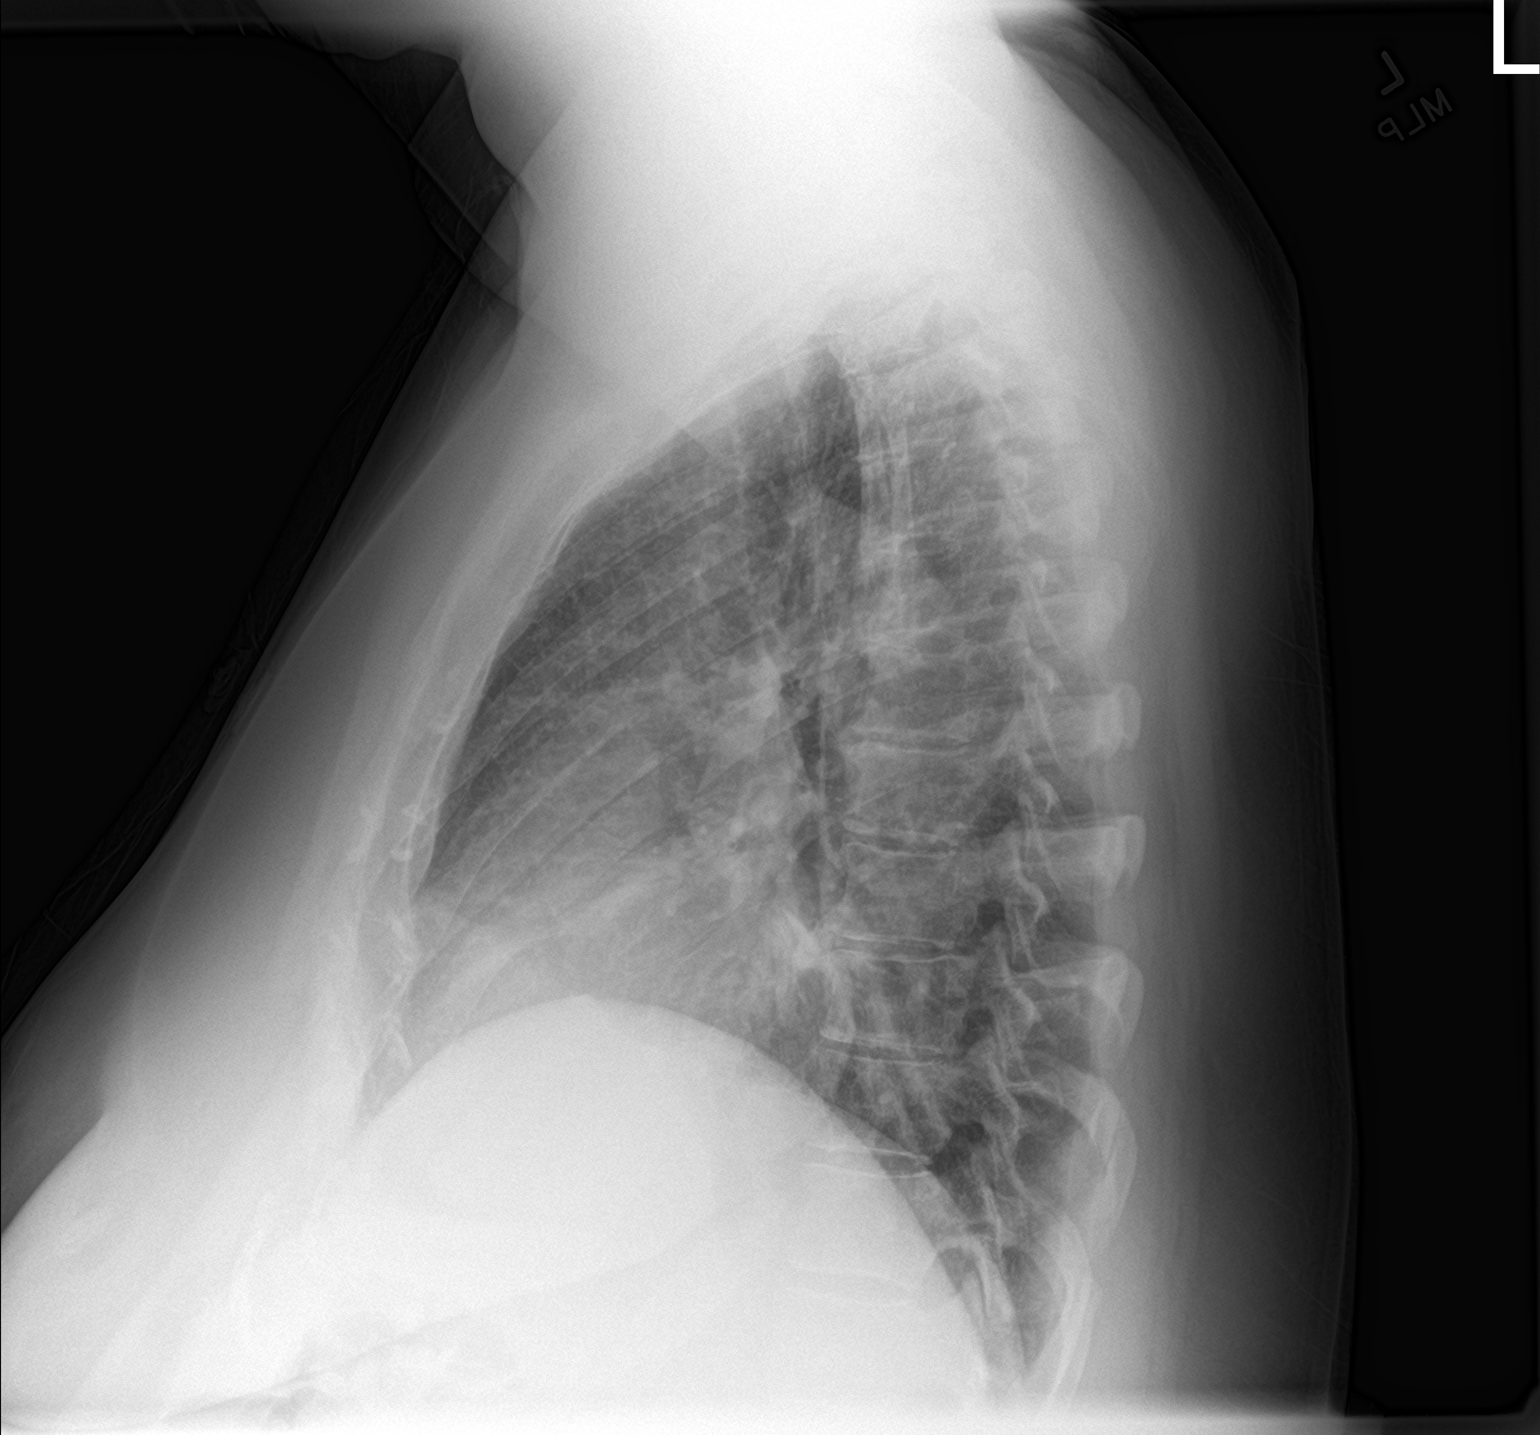

[2 of 2 positions shown; findings below may reference images not displayed]

FINDINGS: The heart size and mediastinal contours are within normal limits.
Probable trace pleural thickening versus scarring at the left
costophrenic angle. The lungs are otherwise clear. No pleural
effusion or pneumothorax. The visualized skeletal structures are
unremarkable.
IMPRESSION: No active cardiopulmonary disease. Stable exam compared to
07/23/2019.

## 2022-06-24 ENCOUNTER — Ambulatory Visit
Admission: RE | Admit: 2022-06-24 | Discharge: 2022-06-24 | Disposition: A | Discharge status: Home / Self Care | Payer: PRIVATE HEALTH INSURANCE | Source: Ambulatory Visit | Attending: Physician Assistant | Admitting: Physician Assistant

## 2022-06-24 VITALS — BP 134/86 | HR 78 | Temp 98.1°F | Resp 18

## 2022-06-24 DIAGNOSIS — M5442 Lumbago with sciatica, left side: Secondary | ICD-10-CM | POA: Diagnosis not present

## 2022-06-24 DIAGNOSIS — M5441 Lumbago with sciatica, right side: Secondary | ICD-10-CM

## 2022-06-24 MED ORDER — METHYLPREDNISOLONE SODIUM SUCC 125 MG IJ SOLR
80.0000 mg | Freq: Once | INTRAMUSCULAR | Status: AC
  Administered 2022-06-24: 80 mg via INTRAMUSCULAR

## 2022-06-24 NOTE — ED Triage Notes (Signed)
Pt presents with bilateral lower back pain that radiates down into hips and both thighs X 2 days.

## 2022-06-24 NOTE — ED Provider Notes (Signed)
[facility]    CSN: [id number] Arrival date & time: 06/24/22  1606      History   Chief Complaint Chief Complaint  Patient presents with   Back Pain    Entered by patient    HPI Robin Castaneda is a 56 y.o. female.   Patient here today for evaluation of bilateral lower back pain that radiates into both hips and thighs.  She states symptoms started 2 days ago.  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  She denies any numbness or tingling.  She has not had fever.  She does not report any other symptoms. Pain is worsened with bending and stretching. She has been taking naproxen and muscle relaxer without resolution of symptoms.   The history is provided by the patient.  Back Pain Associated symptoms: no fever and no numbness     Past Medical History:  Diagnosis Date   Bipolar 1 disorder (HCC)    Depression    Hypertension    Pneumonia     There are no problems to display for this patient.   Past Surgical History:  Procedure Laterality Date   JOINT REPLACEMENT     TUBAL LIGATION      OB History   No obstetric history on file.      Home Medications    Prior to Admission medications   Medication Sig Start Date End Date Taking? Authorizing Provider  albuterol (VENTOLIN HFA) 108 (90 Base) MCG/ACT inhaler Inhale 2 puffs into the lungs every 4 (four) hours as needed for wheezing or shortness of breath. 11/21/19   Hall-Potvin, [place], PA-C  azithromycin (ZITHROMAX) 250 MG tablet Take 1 tablet (250 mg total) by mouth daily. 1 tab every day until finished. 11/21/19   Hall-Potvin, [place], PA-C  buPROPion (WELLBUTRIN XL) 300 MG 24 hr tablet Take 300 mg by mouth daily.  09/02/14   [provider]  chlorthalidone (HYGROTON) 25 MG tablet Take 25 mg by mouth daily.    [provider]  cyclobenzaprine (FLEXERIL) 10 MG tablet Take 0.5-1 tablets (5-10 mg total) by  mouth 3 (three) times daily as needed for muscle spasms. Do not drink alcohol or drive while taking this medication.  May cause drowsiness. 06/13/21   Particia Nearing, PA-C  escitalopram (LEXAPRO) 20 MG tablet Take 40 mg by mouth daily.  09/02/14   [provider]  lamoTRIgine (LAMICTAL) 100 MG tablet Take 300 mg by mouth daily.    [provider]  LORazepam (ATIVAN) 1 MG tablet Take 1 mg by mouth daily as needed for anxiety (severe agitation).  09/12/14   [provider]  naproxen (NAPROSYN) 500 MG tablet Take 1 tablet (500 mg total) by mouth 2 (two) times daily as needed. 06/13/21   Particia Nearing, PA-C  naproxen sodium (ALEVE) 220 MG tablet Take 220 mg by mouth daily as needed (pain).    [provider]  omeprazole (PRILOSEC) 20 MG capsule Take 20 mg by mouth daily.    [provider]  potassium chloride (K-DUR) 10 MEQ tablet Take 3 tablets (30 mEq total) by mouth daily for 4 days. Patient taking differently: Take 20 mEq by mouth daily. 07/23/19 07/27/19  Khatri, Hina, PA-C  triamcinolone ointment (KENALOG) 0.5 % Apply 1 application topically daily as needed (rash).  09/12/14   [provider]    Family History Family History  Problem Relation Age  of Onset   Healthy Mother    Healthy Father     Social History Social History   Tobacco Use   Smoking status: Never   Smokeless tobacco: Never  Substance Use Topics   Alcohol use: No   Drug use: No     Allergies   Latex and Latex   Review of Systems Review of Systems  Constitutional:  Negative for chills and fever.  Eyes:  Negative for discharge and redness.  Gastrointestinal:  Negative for nausea and vomiting.  Musculoskeletal:  Positive for back pain.  Neurological:  Negative for numbness.     Physical Exam Triage Vital Signs ED Triage Vitals  Enc Vitals Group     BP      Pulse      Resp      Temp      Temp src      SpO2      Weight      Height       Head Circumference      Peak Flow      Pain Score      Pain Loc      Pain Edu?      Excl. in GC?    No data found.  Updated Vital Signs BP 134/86 (BP Location: Left Arm)   Pulse 78   Temp 98.1 F (36.7 C) (Oral)   Resp 18   SpO2 96%      Physical Exam Vitals and nursing note reviewed.  Constitutional:      General: She is not in acute distress.    Appearance: Normal appearance. She is not ill-appearing.  HENT:     Head: Normocephalic and atraumatic.  Eyes:     Conjunctiva/sclera: Conjunctivae normal.  Cardiovascular:     Rate and Rhythm: Normal rate.  Pulmonary:     Effort: Pulmonary effort is normal. No respiratory distress.  Musculoskeletal:     Comments: No midline spine TTP, mild TTP to lower back bilaterally  Neurological:     General: No focal deficit present.     Mental Status: She is alert.     Gait: Gait normal.  Psychiatric:        Mood and Affect: Mood normal.        Behavior: Behavior normal.        Thought Content: Thought content normal.      UC Treatments / Results  Labs (all labs ordered are listed, but only abnormal results are displayed) Labs Reviewed - No data to display  EKG   Radiology No results found.  Procedures Procedures (including critical care time)  Medications Ordered in UC Medications  methylPREDNISolone sodium succinate (SOLU-MEDROL) 125 mg/2 mL injection 80 mg (80 mg Intramuscular Given 06/24/22 1703)    Initial Impression / Assessment and Plan / UC Course  I have reviewed the triage vital signs and the nursing notes.  Pertinent labs & imaging results that were available during my care of the patient were reviewed by me and considered in my medical decision making (see chart for details).    Will treat with steroid injection (patient declines oral steroids due to prior reactions). Recommended follow up with PCP should symptoms persist or worsen in any way.   Final Clinical Impressions(s) / UC Diagnoses   Final  diagnoses:  Acute bilateral low back pain with bilateral sciatica   Discharge Instructions   None    ED Prescriptions   None    PDMP not reviewed this  encounter.   Tomi Bamberger, PA-C 06/24/22 1713

## 2022-11-07 ENCOUNTER — Other Ambulatory Visit: Payer: Self-pay

## 2022-11-07 ENCOUNTER — Emergency Department (HOSPITAL_COMMUNITY)
Admission: EM | Admit: 2022-11-07 | Discharge: 2022-11-08 | Disposition: A | Discharge status: Home / Self Care | Payer: Self-pay | Attending: Emergency Medicine | Admitting: Emergency Medicine

## 2022-11-07 ENCOUNTER — Emergency Department (HOSPITAL_COMMUNITY): Payer: Self-pay

## 2022-11-07 DIAGNOSIS — Z91040 Latex allergy status: Secondary | ICD-10-CM | POA: Insufficient documentation

## 2022-11-07 DIAGNOSIS — F17200 Nicotine dependence, unspecified, uncomplicated: Secondary | ICD-10-CM | POA: Insufficient documentation

## 2022-11-07 DIAGNOSIS — I10 Essential (primary) hypertension: Secondary | ICD-10-CM | POA: Insufficient documentation

## 2022-11-07 DIAGNOSIS — R0789 Other chest pain: Secondary | ICD-10-CM | POA: Insufficient documentation

## 2022-11-07 LAB — CBC WITH DIFFERENTIAL/PLATELET
Abs Immature Granulocytes: 0.04 10*3/uL (ref 0.00–0.07)
Basophils Absolute: 0.1 10*3/uL (ref 0.0–0.1)
Basophils Relative: 1 %
Eosinophils Absolute: 0.7 10*3/uL — ABNORMAL HIGH (ref 0.0–0.5)
Eosinophils Relative: 6 %
HCT: 39.6 % (ref 36.0–46.0)
Hemoglobin: 12.7 g/dL (ref 12.0–15.0)
Immature Granulocytes: 0 %
Lymphocytes Relative: 23 %
Lymphs Abs: 2.8 10*3/uL (ref 0.7–4.0)
MCH: 28.5 pg (ref 26.0–34.0)
MCHC: 32.1 g/dL (ref 30.0–36.0)
MCV: 89 fL (ref 80.0–100.0)
Monocytes Absolute: 0.7 10*3/uL (ref 0.1–1.0)
Monocytes Relative: 6 %
Neutro Abs: 7.7 10*3/uL (ref 1.7–7.7)
Neutrophils Relative %: 64 %
Platelets: 255 10*3/uL (ref 150–400)
RBC: 4.45 MIL/uL (ref 3.87–5.11)
RDW: 14.6 % (ref 11.5–15.5)
WBC: 12.1 10*3/uL — ABNORMAL HIGH (ref 4.0–10.5)
nRBC: 0 % (ref 0.0–0.2)

## 2022-11-07 NOTE — ED Triage Notes (Signed)
Patient reports left chest pain this evening , denies SOB , no emesis or diaphoresis , pain increases with deep inspiration , no cough or fever .

## 2022-11-07 NOTE — ED Provider Triage Note (Signed)
Emergency Medicine Provider Triage Evaluation Note  Robin Castaneda , a 56 y.o. female  was evaluated in triage.  Pt complains of chest pain, started today, after she got off shift, she was resting, pain is not underneath her left breast, she feel rating to her left shoulder, other than pain she has no other complaints does not endorse pleuritic chest pain, become diaphoretic, nausea vomiting, she says she feels slightly short of breath, no leg swelling, other than tobacco user as well as some family history of CAD she has no other cardiac risk factors i.e hypertension, diabetes, hyperlipidemia..  Prior to arrival patient took 650 mg of aspirin.  Review of Systems  Positive: Chest pain, shoulder pain Negative: Nausea, vomiting  Physical Exam  BP (!) 177/100   Pulse 68   Resp 18   SpO2 97%  Gen:   Awake, no distress   Resp:  Normal effort  MSK:   Moves extremities without difficulty  Other:    Medical Decision Making  Medically screening exam initiated at 10:44 PM.  Appropriate orders placed.  Robin Castaneda was informed that the remainder of the evaluation will be completed by another provider, this initial triage assessment does not replace that evaluation, and the importance of remaining in the ED until their evaluation is complete.  Lab and imaging have been ordered will need further workup.   Carroll Sage, PA-C 11/07/22 2245

## 2022-11-08 ENCOUNTER — Encounter: Payer: Self-pay | Admitting: Cardiovascular Disease

## 2022-11-08 ENCOUNTER — Emergency Department (INDEPENDENT_AMBULATORY_CARE_PROVIDER_SITE_OTHER): Payer: Self-pay | Admitting: Cardiovascular Disease

## 2022-11-08 VITALS — BP 145/90 | HR 60 | Ht 68.0 in | Wt 241.2 lb

## 2022-11-08 DIAGNOSIS — E782 Mixed hyperlipidemia: Secondary | ICD-10-CM

## 2022-11-08 DIAGNOSIS — I10 Essential (primary) hypertension: Secondary | ICD-10-CM

## 2022-11-08 DIAGNOSIS — R079 Chest pain, unspecified: Secondary | ICD-10-CM

## 2022-11-08 LAB — BASIC METABOLIC PANEL
Anion gap: 9 (ref 5–15)
BUN: 16 mg/dL (ref 6–20)
CO2: 27 mmol/L (ref 22–32)
Calcium: 8.5 mg/dL — ABNORMAL LOW (ref 8.9–10.3)
Chloride: 103 mmol/L (ref 98–111)
Creatinine, Ser: 1.12 mg/dL — ABNORMAL HIGH (ref 0.44–1.00)
GFR, Estimated: 58 mL/min — ABNORMAL LOW (ref >60)
Glucose, Bld: 89 mg/dL (ref 70–99)
Potassium: 4 mmol/L (ref 3.5–5.1)
Sodium: 139 mmol/L (ref 135–145)

## 2022-11-08 LAB — TROPONIN I (HIGH SENSITIVITY)
Troponin I (High Sensitivity): 3 ng/L (ref <18)
Troponin I (High Sensitivity): 3 ng/L (ref <18)

## 2022-11-08 MED ORDER — METOPROLOL TARTRATE 50 MG PO TABS
ORAL_TABLET | ORAL | 0 refills | Status: DC
Start: 2022-11-08 — End: 2022-11-08

## 2022-11-08 NOTE — Patient Instructions (Addendum)
Medication Instructions:  Your physician recommends that you continue on your current medications as directed. Please refer to the Current Medication list given to you today.  *If you need a refill on your cardiac medications before your next appointment, please call your pharmacy*  Lab Work: Lipids, ALT, BMET, LP(a) today If you have labs (blood work) drawn today and your tests are completely normal, you will receive your results only by: MyChart Message (if you have MyChart) OR A paper copy in the mail If you have any lab test that is abnormal or we need to change your treatment, we will call you to review the results.  Testing/Procedures: Coronary CT Angiogram Your physician has requested that you have cardiac CT. Cardiac computed tomography (CT) is a painless test that uses an x-ray machine to take clear, detailed pictures of your heart. For further information please visit [email or web address] Please follow instruction sheet as given.  ECHO Your physician has requested that you have an echocardiogram. Echocardiography is a painless test that uses sound waves to create images of your heart. It provides your doctor with information about the size and shape of your heart and how well your heart's chambers and valves are working. This procedure takes approximately one hour. There are no restrictions for this procedure. Please do NOT wear cologne, perfume, aftershave, or lotions (deodorant is allowed). Please arrive 15 minutes prior to your appointment time.  Follow-Up: At [facility], you and your health needs are our priority.  As part of our continuing mission to provide you with exceptional heart care, we have created designated Provider Care Teams.  These Care Teams include your primary Cardiologist (physician) and Advanced Practice Providers (APPs -  Physician Assistants and Nurse Practitioners) who all work together to provide you with the care you need, when you need  it.  Your next appointment:   3 month(s)  The format for your next appointment:   In Person  Provider:   Kristeen Miss, MD   Other Instructions   Your cardiac CT will be scheduled at:   [facility] [address] [place], [place] [address] [phone]  please arrive at the [facility] and Children's Entrance (Entrance C2) of [facility] 30 minutes prior to test start time. You can use the FREE valet parking offered at entrance C (encouraged to control the heart rate for the test)  Proceed to the [facility] Radiology Department (first floor) to check-in and test prep.  All radiology patients and guests should use entrance C2 at [facility], accessed from [facility], even though the hospital's physical address listed is [address].     Please follow these instructions carefully (unless otherwise directed):  On the Night Before the Test: Be sure to Drink plenty of water. Do not consume any caffeinated/decaffeinated beverages or chocolate 12 hours prior to your test. Do not take any antihistamines 12 hours prior to your test.   On the Day of the Test: Drink plenty of water until 1 hour prior to the test. Do not eat any food 1 hour prior to test. You may take your regular medications prior to the test.  Take metoprolol (Lopressor) two hours prior to test. FEMALES- please wear underwire-free bra if available, avoid dresses & tight clothing  After the Test: Drink plenty of water. After receiving IV contrast, you may experience a mild flushed feeling. This is normal. On occasion, you may experience a mild rash up to 24 hours  after the test. This is not dangerous. If this occurs, you can take Benadryl 25 mg and increase your fluid intake. If you experience trouble breathing, this can be serious. If it is severe call 911 IMMEDIATELY. If it is mild, please call our office. If you take any of these medications:  Glipizide/Metformin, Avandament, Glucavance, please do not take 48 hours after completing test unless otherwise instructed.  We will call to schedule your test 2-4 weeks out understanding that some insurance companies will need an authorization prior to the service being performed.   For non-scheduling related questions, please contact the cardiac imaging nurse navigator should you have any questions/concerns: Rockwell Alexandria, Cardiac Imaging Nurse Navigator Larey Brick, Cardiac Imaging Nurse Navigator [facility] Direct Office Dial: [phone]   For scheduling needs, including cancellations and rescheduling, please call [place], [phone].   Important Information About Sugar

## 2022-11-08 NOTE — Progress Notes (Signed)
Cardiology Office Note:    Date:  11/08/2022   ID:  Robin Castaneda, DOB 1966/07/17, MRN [id number]  PCP:  Redmond Pulling, MD   [place] [facility] Cardiologist:  Kraig Genis   Referring MD: Merryl Hacker, MD   Chief Complaint  Patient presents with   Chest Pain    History of Present Illness:    Robin Castaneda is a 56 y.o. female with a hx of CP , HTN, bipolar 1 disorder .  Is here to discuss chest pain Had stabbling pain in her left breast , radiated through to her back  Went to er  Troponin was normal   Has been seen in the ER with CP several times in the past   Not exertional ,  Not related to eating , drinking,  or taking a deep breath  Does not exercise  Is a nurse at [employer], in [place] hx Father had Afib, pacer, CHF, CABG , several stents  Mother has Afib   Lipids have been elevated in the past  Has lost 50 lbs in the past year.   Going to [employer] [facility] loss   Stopped smoking last night   Labs from July, 2023 show a total cholesterol of 175 Triglyceride level is 104 HDL is 45 LDL is 111 TSH is normal Liver enzymes are normal.  Past Medical History:  Diagnosis Date   Bipolar 1 disorder ([place])    Depression    Hypertension    Pneumonia     Past Surgical History:  Procedure Laterality Date   JOINT REPLACEMENT     TUBAL LIGATION      Current Medications: Current Meds  Medication Sig   azithromycin (ZITHROMAX) 250 MG tablet Take 1 tablet (250 mg total) by mouth daily. 1 tab every day until finished.   buPROPion (WELLBUTRIN XL) 300 MG 24 hr tablet Take 300 mg by mouth daily. Take 0.5-1 tablets by mouth  3 (times) daily as needed for muscle spasms.   escitalopram (LEXAPRO) 20 MG tablet Take 40 mg by mouth daily.    lamoTRIgine (LAMICTAL) 100 MG tablet Take 300 mg by mouth daily.   LORazepam (ATIVAN) 1 MG tablet Take 1 mg by mouth daily as needed for anxiety (severe agitation).    [DISCONTINUED] metoprolol  tartrate (LOPRESSOR) 50 MG tablet Take 1 tablet by mouth two hours prior to scan     Allergies:   Latex and Latex   Social History   Socioeconomic History   Marital status: Married    Spouse name: Not on file   Number of children: Not on file   Years of education: Not on file   Highest education level: Not on file  Occupational History   Not on file  Tobacco Use   Smoking status: Never   Smokeless tobacco: Never  Substance and Sexual Activity   Alcohol use: No   Drug use: No   Sexual activity: Not on file  Other Topics Concern   Not on file  Social History Narrative   ** Merged History Encounter **       Social Determinants of Health   Financial Resource Strain: Not on file  Food Insecurity: Not on file  Transportation Needs: Not on file  Physical Activity: Not on file  Stress: Not on file  Social Connections: Not on file     Family History: The patient's family history includes Healthy in her father and mother.  ROS:   Please see  the history of present illness.     All other systems reviewed and are negative.  EKGs/Labs/Other Studies Reviewed:    The following studies were reviewed today:   EKG: November 08, 2022: Normal sinus rhythm at 60.  Normal EKG  Recent Labs: 11/07/2022: BUN 16; Creatinine, Ser 1.12; Hemoglobin 12.7; Platelets 255; Potassium 4.0; Sodium 139  Recent Lipid Panel No results found for: "CHOL", "TRIG", "HDL", "CHOLHDL", "VLDL", "LDLCALC", "LDLDIRECT"   Risk Assessment/Calculations:                Physical Exam:    VS:  BP (!) 145/90   Pulse 60   Ht 5\' 8"  (1.727 m)   Wt 241 lb 3.2 oz (109.4 kg)   SpO2 96%   BMI 36.67 kg/m     Wt Readings from Last 3 Encounters:  11/08/22 241 lb 3.2 oz (109.4 kg)  07/02/21 270 lb (122.5 kg)  07/23/19 288 lb (130.6 kg)     GEN:  moderately obese, middle age  female in no acute distress HEENT: Normal NECK: No JVD; No carotid bruits LYMPHATICS: No lymphadenopathy CARDIAC: RRR, no  murmurs, rubs, gallops RESPIRATORY:  Clear to auscultation without rales, wheezing or rhonchi  ABDOMEN: Soft, non-tender, non-distended MUSCULOSKELETAL:  No edema; No deformity  SKIN: Warm and dry NEUROLOGIC:  Alert and oriented x 3 PSYCHIATRIC:  Normal affect   ASSESSMENT:    1. Chest pain of uncertain etiology   2. Mixed hyperlipidemia   3. Primary hypertension    PLAN:      Chest pain:   atypical ,  trops negative.  She has had episodes of chest pain for the past many months. Has had multiple ER visits for CP  We discussed getting a coronary CT angiogram and echocardiogram.  Unfortunate, the patient does not have insurance and does not think that should be able to afford these test.  She does not want to get them now.  Will continue to follow clinically.  Will see her again in 3 months.  2. HTN:   will continue to monitor  Advised continued diet Increase exercise  Office visit in 3 months             Medication Adjustments/Labs and Tests Ordered: Current medicines are reviewed at length with the patient today.  Concerns regarding medicines are outlined above.  Orders Placed This Encounter  Procedures   Lipid panel   ALT   Basic metabolic panel   Lipoprotein A (LPA)   EKG 12-Lead   ECHOCARDIOGRAM COMPLETE   Meds ordered this encounter  Medications   DISCONTD: metoprolol tartrate (LOPRESSOR) 50 MG tablet    Sig: Take 1 tablet by mouth two hours prior to scan    Dispense:  1 tablet    Refill:  0    Patient Instructions  Medication Instructions:  Your physician recommends that you continue on your current medications as directed. Please refer to the Current Medication list given to you today.  *If you need a refill on your cardiac medications before your next appointment, please call your pharmacy*  Lab Work: Lipids, ALT, BMET, LP(a) today If you have labs (blood work) drawn today and your tests are completely normal, you will receive your results only  by: [place] (if you have MyChart) OR A paper copy in the mail If you have any lab test that is abnormal or we need to change your treatment, we will call you to review the results.  Testing/Procedures: Coronary CT Angiogram  Your physician has requested that you have cardiac CT. Cardiac computed tomography (CT) is a painless test that uses an x-ray machine to take clear, detailed pictures of your heart. For further information please visit [email or web address]. Please follow instruction sheet as given.  ECHO Your physician has requested that you have an echocardiogram. Echocardiography is a painless test that uses sound waves to create images of your heart. It provides your doctor with information about the size and shape of your heart and how well your heart's chambers and valves are working. This procedure takes approximately one hour. There are no restrictions for this procedure. Please do NOT wear cologne, perfume, aftershave, or lotions (deodorant is allowed). Please arrive 15 minutes prior to your appointment time.  Follow-Up: At [facility], you and your health needs are our priority.  As part of our continuing mission to provide you with exceptional heart care, we have created designated Provider Care Teams.  These Care Teams include your primary Cardiologist (physician) and Advanced Practice Providers (APPs -  Physician Assistants and Nurse Practitioners) who all work together to provide you with the care you need, when you need it.  Your next appointment:   3 month(s)  The format for your next appointment:   In Person  Provider:   Mertie Moores, MD   Other Instructions   Your cardiac CT will be scheduled at:   [facility] [address] [place], [place] [address] [phone]  please arrive at the [facility] Entrance (Entrance C2) of [facility] 30 minutes prior to test start time. You can use the FREE valet parking  offered at entrance C (encouraged to control the heart rate for the test)  Proceed to the [facility] Radiology Department (first floor) to check-in and test prep.  All radiology patients and guests should use entrance C2 at [facility], accessed from [facility], even though the hospital's physical address listed is [address]     Please follow these instructions carefully (unless otherwise directed):  On the Night Before the Test: Be sure to Drink plenty of water. Do not consume any caffeinated/decaffeinated beverages or chocolate 12 hours prior to your test. Do not take any antihistamines 12 hours prior to your test.   On the Day of the Test: Drink plenty of water until 1 hour prior to the test. Do not eat any food 1 hour prior to test. You may take your regular medications prior to the test.  Take metoprolol (Lopressor) two hours prior to test. FEMALES- please wear underwire-free bra if available, avoid dresses & tight clothing  After the Test: Drink plenty of water. After receiving IV contrast, you may experience a mild flushed feeling. This is normal. On occasion, you may experience a mild rash up to 24 hours after the test. This is not dangerous. If this occurs, you can take Benadryl 25 mg and increase your fluid intake. If you experience trouble breathing, this can be serious. If it is severe call 911 IMMEDIATELY. If it is mild, please call our office. If you take any of these medications: Glipizide/Metformin, Avandament, Glucavance, please do not take 48 hours after completing test unless otherwise instructed.  We will call to schedule your test 2-4 weeks out understanding that some insurance companies will need an authorization prior to the service being performed.   For non-scheduling related questions, please contact the cardiac imaging nurse navigator should you have any questions/concerns: Marchia Bond, Cardiac Imaging  Nurse  Navigator Larey Brick, Cardiac Imaging Nurse Navigator [facility] Direct Office Dial: [phone]   For scheduling needs, including cancellations and rescheduling, please call [place], [phone].   Important Information About Sugar         Signed, Kristeen Miss, MD  11/08/2022 6:14 PM    [facility]

## 2022-11-08 NOTE — ED Provider Notes (Signed)
[facility] EMERGENCY DEPARTMENT Provider Note   CSN: [id number] Arrival date & time: 11/07/22  2215     History  Chief Complaint  Patient presents with   Chest Pain    Robin Castaneda is a 56 y.o. female.  HPI     This a 56 year old female with a history of smoking and hypertension who presents with chest pain.  Patient reports that she began having chest discomfort around 730 after getting off work.  She states it was underneath her left breast and "I felt like my nipple was hurting."  She states that she did have some left shoulder pain.  It was not exertional in nature.  Nothing seems to make it better or worse.  Not pleuritic in nature.  She has a prior history of hypertension and hyperlipidemia but has since lost 40 pounds and states that she no longer is treated for these.  She has never had an ischemic evaluation.  Denies any history of heart disease or early family history of heart disease.  She is currently pain-free.  Denies any recent URI symptoms, cough, fevers.  Home Medications Prior to Admission medications   Medication Sig Start Date End Date Taking? Authorizing Provider  albuterol (VENTOLIN HFA) 108 (90 Base) MCG/ACT inhaler Inhale 2 puffs into the lungs every 4 (four) hours as needed for wheezing or shortness of breath. 11/21/19   Hall-Potvin, [place], PA-C  azithromycin (ZITHROMAX) 250 MG tablet Take 1 tablet (250 mg total) by mouth daily. 1 tab every day until finished. 11/21/19   Hall-Potvin, [place], PA-C  buPROPion (WELLBUTRIN XL) 300 MG 24 hr tablet Take 300 mg by mouth daily.  09/02/14   [provider]  chlorthalidone (HYGROTON) 25 MG tablet Take 25 mg by mouth daily.    [provider]  cyclobenzaprine (FLEXERIL) 10 MG tablet Take 0.5-1 tablets (5-10 mg total) by mouth 3 (three) times daily as needed for muscle spasms. Do not drink alcohol or drive while taking this medication.  May cause drowsiness. 06/13/21   Particia Nearing, PA-C  escitalopram (LEXAPRO) 20 MG tablet Take 40 mg by mouth daily.  09/02/14   [provider]  lamoTRIgine (LAMICTAL) 100 MG tablet Take 300 mg by mouth daily.    [provider]  LORazepam (ATIVAN) 1 MG tablet Take 1 mg by mouth daily as needed for anxiety (severe agitation).  09/12/14   [provider]  naproxen (NAPROSYN) 500 MG tablet Take 1 tablet (500 mg total) by mouth 2 (two) times daily as needed. 06/13/21   Particia Nearing, PA-C  naproxen sodium (ALEVE) 220 MG tablet Take 220 mg by mouth daily as needed (pain).    [provider]  omeprazole (PRILOSEC) 20 MG capsule Take 20 mg by mouth daily.    [provider]  potassium chloride (K-DUR) 10 MEQ tablet Take 3 tablets (30 mEq total) by mouth daily for 4 days. Patient taking differently: Take 20 mEq by mouth daily. 07/23/19 07/27/19  Khatri, [place], PA-C  triamcinolone ointment (KENALOG) 0.5 % Apply 1 application topically daily as needed (rash).  09/12/14   [provider]      Allergies    Latex and Latex    Review of Systems   Review of Systems  Constitutional:  Negative for fever.  Respiratory:  Negative for cough and shortness of breath.   Cardiovascular:  Positive for chest pain. Negative for leg swelling.  All other systems reviewed and are negative.  Physical Exam Updated Vital Signs BP (!) 142/70   Pulse 73   Temp 97.9 F (36.6 C)   Resp 17   SpO2 95%  Physical Exam Vitals and nursing note reviewed.  Constitutional:      Appearance: She is well-developed. She is obese. She is not ill-appearing.  HENT:     Head: Normocephalic and atraumatic.  Eyes:     Pupils: Pupils are equal, round, and reactive to light.  Cardiovascular:     Rate and Rhythm: Normal rate and regular rhythm.     Heart sounds: Normal heart sounds.  Pulmonary:     Effort: Pulmonary effort is normal. No respiratory distress.     Breath sounds: No wheezing.  Chest:      Chest wall: No tenderness.  Abdominal:     General: Bowel sounds are normal.     Palpations: Abdomen is soft.  Musculoskeletal:     Cervical back: Neck supple.  Skin:    General: Skin is warm and dry.  Neurological:     Mental Status: She is alert and oriented to person, place, and time.  Psychiatric:        Mood and Affect: Mood normal.     ED Results / Procedures / Treatments   Labs (all labs ordered are listed, but only abnormal results are displayed) Labs Reviewed  CBC WITH DIFFERENTIAL/PLATELET - Abnormal; Notable for the following components:      Result Value   WBC 12.1 (*)    Eosinophils Absolute 0.7 (*)    All other components within normal limits  BASIC METABOLIC PANEL - Abnormal; Notable for the following components:   Creatinine, Ser 1.12 (*)    Calcium 8.5 (*)    GFR, Estimated 58 (*)    All other components within normal limits  TROPONIN I (HIGH SENSITIVITY)  TROPONIN I (HIGH SENSITIVITY)    EKG EKG Interpretation  Date/Time:  Thursday November 07 2022 22:55:48 EST Ventricular Rate:  68 PR Interval:  160 QRS Duration: 88 QT Interval:  432 QTC Calculation: 459 R Axis:   22 Text Interpretation: Normal sinus rhythm Cannot rule out Anterior infarct , age undetermined Abnormal ECG When compared with ECG of 02-Jul-2021 15:43, PREVIOUS ECG IS PRESENT No significant change since last tracing Confirmed by Ross Marcus ([id number]) on 11/08/2022 1:53:53 AM  Radiology DG Chest 2 View  Result Date: 11/07/2022 CLINICAL DATA:  chest pain EXAM: CHEST - 2 VIEW COMPARISON:  Chest x-ray 07/02/2021 FINDINGS: The heart and mediastinal contours are unchanged. Left base atelectasis. No focal consolidation. No pulmonary edema. No pleural effusion. No pneumothorax. No acute osseous abnormality. IMPRESSION: No active cardiopulmonary disease. Electronically Signed   By: Tish Frederickson M.D.   On: 11/07/2022 23:27    Procedures Procedures    Medications Ordered in  ED Medications - No data to display  ED Course/ Medical Decision Making/ A&P                           Medical Decision Making  This patient presents to the ED for concern of chest pain, this involves an extensive number of treatment options, and is a complaint that carries with it a high risk of complications and morbidity.  I considered the following differential and admission for this acute, potentially life threatening condition.  The differential diagnosis includes ACS, PE, pneumonia, pneumothorax, chest wall pain  MDM:    This is a 56 year old female who presents with chest  pain.  She is currently symptom-free.  She is nontoxic.  Vital signs notable for blood pressure 177/100.  Reports that hypertension has been more well-controlled after losing weight.  Blood pressure down trended to 142/70.  Symptoms are atypical.  Nonexertional.  She has no reproducible elements of the pain.  EKG shows no evidence of acute ischemia or arrhythmia.  Troponin x 2 negative.  Given atypical nature of symptoms, would doubt acute ACS.  She has no signs or symptoms of DVT.  Have low suspicion for PE.  Chest x-ray without pneumothorax or pneumonia.  Patient has remained symptom-free.  Heart score is 4 but given the atypical nature of the symptoms, feel it is reasonable that she follow-up as an outpatient.  She was referred to cardiology.  She was given return precautions.  (Labs, imaging, consults)  Labs: I Ordered, and personally interpreted labs.  The pertinent results include: CBC, BMP, troponin x 2  Imaging Studies ordered: I ordered imaging studies including chest x-ray I independently visualized and interpreted imaging. I agree with the radiologist interpretation  Additional history obtained from chart review.  External records from outside source obtained and reviewed including evaluations  Cardiac Monitoring: The patient was maintained on a cardiac monitor.  I personally viewed and interpreted the  cardiac monitored which showed an underlying rhythm of: Sinus rhythm  Reevaluation: After the interventions noted above, I reevaluated the patient and found that they have :improved  Social Determinants of Health:  lives independently, smoker  Disposition: Discharge  Co morbidities that complicate the patient evaluation  Past Medical History:  Diagnosis Date   Bipolar 1 disorder (HCC)    Depression    Hypertension    Pneumonia      Medicines No orders of the defined types were placed in this encounter.   I have reviewed the patients home medicines and have made adjustments as needed  Problem List / ED Course: Problem List Items Addressed This Visit   None Visit Diagnoses     Atypical chest pain    -  Primary   Relevant Orders   Ambulatory referral to Cardiology                   Final Clinical Impression(s) / ED Diagnoses Final diagnoses:  Atypical chest pain    Rx / DC Orders ED Discharge Orders          Ordered    Ambulatory referral to Cardiology        11/08/22 0348              Shon Baton, MD 11/08/22 0403

## 2022-11-08 NOTE — Discharge Instructions (Signed)
You were seen today for chest pain.  Your workup today is largely reassuring.  Given your age and risk factors, it is important that you follow-up with cardiology for definitive testing including potential stress testing.  You were referred and should receive a phone call.  If not, call number provided.

## 2022-11-11 LAB — LIPID PANEL
Chol/HDL Ratio: 3.4 ratio (ref 0.0–4.4)
Cholesterol, Total: 186 mg/dL (ref 100–199)
HDL: 55 mg/dL (ref >39)
LDL Chol Calc (NIH): 117 mg/dL — ABNORMAL HIGH (ref 0–99)
Triglycerides: 78 mg/dL (ref 0–149)
VLDL Cholesterol Cal: 14 mg/dL (ref 5–40)

## 2022-11-11 LAB — BASIC METABOLIC PANEL
BUN/Creatinine Ratio: 18 (ref 9–23)
BUN: 14 mg/dL (ref 6–24)
CO2: 23 mmol/L (ref 20–29)
Calcium: 8.8 mg/dL (ref 8.7–10.2)
Chloride: 102 mmol/L (ref 96–106)
Creatinine, Ser: 0.76 mg/dL (ref 0.57–1.00)
Glucose: 82 mg/dL (ref 70–99)
Potassium: 4.1 mmol/L (ref 3.5–5.2)
Sodium: 144 mmol/L (ref 134–144)
eGFR: 93 mL/min/{1.73_m2} (ref >59)

## 2022-11-11 LAB — LIPOPROTEIN A (LPA): Lipoprotein (a): 52.1 nmol/L (ref <75.0)

## 2022-11-11 LAB — ALT: ALT: 12 IU/L (ref 0–32)

## 2022-11-14 ENCOUNTER — Encounter: Payer: Self-pay | Admitting: Cardiovascular Disease

## 2022-11-14 DIAGNOSIS — Z79899 Other long term (current) drug therapy: Secondary | ICD-10-CM

## 2022-11-15 MED ORDER — ROSUVASTATIN CALCIUM 20 MG PO TABS: 20.0000 mg | ORAL_TABLET | Freq: Every day | ORAL | 3 refills | Status: DC

## 2022-11-15 NOTE — Telephone Encounter (Signed)
Attempted twice to contact pt with lab results. Left message at last attempt with details and asked that she send MyChart message if she agreed to MD's recommendations (below). Pt writing in to say she will try medication and do labs. Sent to pharmacy on file. Labs ordered and scheduled for 02/10/23 (next OV).  Nahser, Deloris Ping, MD  Kyi Romanello K LDL is 117 Lets start Rosuvastatin 20 mg a day Check lipids , ALT in 3 months

## 2022-12-05 ENCOUNTER — Other Ambulatory Visit (HOSPITAL_COMMUNITY): Payer: Self-pay

## 2023-01-01 ENCOUNTER — Ambulatory Visit (HOSPITAL_COMMUNITY): Payer: Commercial Managed Care - PPO | Attending: Cardiovascular Disease

## 2023-01-01 DIAGNOSIS — I10 Essential (primary) hypertension: Secondary | ICD-10-CM

## 2023-01-01 DIAGNOSIS — R079 Chest pain, unspecified: Secondary | ICD-10-CM

## 2023-01-01 DIAGNOSIS — E782 Mixed hyperlipidemia: Secondary | ICD-10-CM

## 2023-01-02 ENCOUNTER — Encounter: Payer: Self-pay | Admitting: Cardiovascular Disease

## 2023-01-02 DIAGNOSIS — R079 Chest pain, unspecified: Secondary | ICD-10-CM

## 2023-01-02 LAB — ECHOCARDIOGRAM COMPLETE
Area-P 1/2: 3.42 cm2
S' Lateral: 3 cm

## 2023-01-10 MED ORDER — METOPROLOL TARTRATE 50 MG PO TABS: ORAL_TABLET | ORAL | 0 refills | Status: DC

## 2023-01-10 NOTE — Telephone Encounter (Signed)
Per OV note on 11/08/22:  Chest pain:   atypical ,  trops negative.  She has had episodes of chest pain for the past many months. Has had multiple ER visits for CP   We discussed getting a coronary CT angiogram and echocardiogram.  Unfortunate, the patient does not have insurance and does not think that should be able to afford these test.  She does not want to get them now.   Will continue to follow clinically.  Will see her again in 3 months.  Patient writes in and wants to get the CT done since she got insurance earlier. Order placed for CTA. Patient instructions sent via [place]. Metoprolol for HR control sent to pharmacy.

## 2023-01-17 ENCOUNTER — Telehealth (HOSPITAL_COMMUNITY): Payer: Self-pay | Admitting: Emergency Medicine

## 2023-01-17 NOTE — Telephone Encounter (Signed)
Reaching out to patient to offer assistance regarding upcoming cardiac imaging study; pt verbalizes understanding of appt date/time, parking situation and where to check in, pre-test NPO status and medications ordered, and verified current allergies; name and call back number provided for further questions should they arise Marchia Bond RN Navigator Cardiac Imaging [facility] [phone] office [phone] cell  Arrival 330 WC Entrance Denies iv issues '50mg'$  metoprolol tartrate Aware contrast/nitro

## 2023-01-17 NOTE — Telephone Encounter (Signed)
Attempted to call patient regarding upcoming cardiac CT appointment. °Left message on voicemail with name and callback number °Shariya Gaster RN Navigator Cardiac Imaging °[facility] °[phone] Office °[phone] Cell ° °

## 2023-01-20 ENCOUNTER — Other Ambulatory Visit (HOSPITAL_COMMUNITY): Payer: Self-pay | Admitting: *Deleted

## 2023-01-20 ENCOUNTER — Ambulatory Visit (HOSPITAL_COMMUNITY): Admission: RE | Admit: 2023-01-20 | Payer: No Typology Code available for payment source | Source: Ambulatory Visit

## 2023-01-20 MED ORDER — METOPROLOL TARTRATE 50 MG PO TABS: ORAL_TABLET | ORAL | 0 refills | Status: DC

## 2023-02-09 ENCOUNTER — Encounter: Payer: Self-pay | Admitting: Cardiovascular Disease

## 2023-02-09 NOTE — Progress Notes (Signed)
This encounter was created in error - please disregard.

## 2023-02-10 ENCOUNTER — Ambulatory Visit: Payer: Self-pay | Admitting: Cardiovascular Disease

## 2023-02-10 ENCOUNTER — Ambulatory Visit: Payer: Commercial Managed Care - PPO | Admitting: Cardiovascular Disease

## 2023-02-10 ENCOUNTER — Ambulatory Visit: Payer: Commercial Managed Care - PPO

## 2023-03-06 ENCOUNTER — Encounter (HOSPITAL_COMMUNITY): Payer: Self-pay

## 2023-04-03 ENCOUNTER — Encounter (HOSPITAL_COMMUNITY): Payer: Self-pay

## 2023-04-14 ENCOUNTER — Encounter: Payer: Self-pay | Admitting: Cardiovascular Disease

## 2023-04-14 NOTE — Progress Notes (Unsigned)
Cardiology Office Note:    Date:  04/14/2023   ID:  Robin Castaneda, DOB 1965-12-15, MRN [id number]  PCP:  Jim Like, MD    [facility] Providers Cardiologist:  Jezel Basto   Referring MD: Jim Like, MD   Chief Complaint  Patient presents with   Chest Pain    History of Present Illness:    Robin Castaneda is a 57 y.o. female with a hx of CP , HTN, bipolar 1 disorder .  Is here to discuss chest pain Had stabbling pain in her left breast , radiated through to her back  Went to er  Troponin was normal   Has been seen in the ER with CP several times in the past   Not exertional ,  Not related to eating , drinking,  or taking a deep breath  Does not exercise  Is a nurse at [employer], in [place] hx Father had Afib, pacer, CHF, CABG , several stents  Mother has Afib   Lipids have been elevated in the past  Has lost 50 lbs in the past year.   Going to [employer] [facility]   Stopped smoking last night   Labs from July, 2023 show a total cholesterol of 175 Triglyceride level is 104 HDL is 45 LDL is 111 TSH is normal Liver enzymes are normal.  Apr 15, 2023 Robin Castaneda is seen for follow up visit for her chest pains    Past Medical History:  Diagnosis Date   Bipolar 1 disorder (HCC)    Depression    Hypertension    Pneumonia     Past Surgical History:  Procedure Laterality Date   JOINT REPLACEMENT     TUBAL LIGATION      Current Medications: Current Meds  Medication Sig   hydrochlorothiazide (HYDRODIURIL) 12.5 MG tablet Take 12.5 mg by mouth at bedtime.     Allergies:   Latex and Latex   Social History   Socioeconomic History   Marital status: Married    Spouse name: Not on file   Number of children: Not on file   Years of education: Not on file   Highest education level: Not on file  Occupational History   Not on file  Tobacco Use   Smoking status: Never   Smokeless tobacco: Never  Substance and Sexual  Activity   Alcohol use: No   Drug use: No   Sexual activity: Not on file  Other Topics Concern   Not on file  Social History Narrative   ** Merged History Encounter **       Social Determinants of Health   Financial Resource Strain: Not on file  Food Insecurity: Not on file  Transportation Needs: Not on file  Physical Activity: Not on file  Stress: Not on file  Social Connections: Not on file     Family History: The patient's family history includes Healthy in her father and mother.  ROS:   Please see the history of present illness.     All other systems reviewed and are negative.  EKGs/Labs/Other Studies Reviewed:    The following studies were reviewed today:   EKG: November 08, 2022: Normal sinus rhythm at 60.  Normal EKG  Recent Labs: 11/07/2022: Hemoglobin 12.7; Platelets 255 11/08/2022: ALT 12; BUN 14; Creatinine, Ser 0.76; Potassium 4.1; Sodium 144  Recent Lipid Panel    Component Value Date/Time   CHOL 186 11/08/2022 1502   TRIG 78 11/08/2022 1502   HDL  55 11/08/2022 1502   CHOLHDL 3.4 11/08/2022 1502   LDLCALC 117 (H) 11/08/2022 1502     Risk Assessment/Calculations:       Physical Exam:    Physical Exam: There were no vitals taken for this visit.  No BP recorded.  {Refresh Note OR Click here to enter BP  :1}***    GEN:  Well nourished, well developed in no acute distress HEENT: Normal NECK: No JVD; No carotid bruits LYMPHATICS: No lymphadenopathy CARDIAC: RRR ***, no murmurs, rubs, gallops RESPIRATORY:  Clear to auscultation without rales, wheezing or rhonchi  ABDOMEN: Soft, non-tender, non-distended MUSCULOSKELETAL:  No edema; No deformity  SKIN: Warm and dry NEUROLOGIC:  Alert and oriented x 3   ASSESSMENT:    No diagnosis found.  PLAN:      Chest pain:   atypical ,  trops negative.  She has had episodes of chest pain for the past many months. Has had multiple ER visits for CP    2. HTN:               Medication  Adjustments/Labs and Tests Ordered: Current medicines are reviewed at length with the patient today.  Concerns regarding medicines are outlined above.  No orders of the defined types were placed in this encounter.  No orders of the defined types were placed in this encounter.   There are no Patient Instructions on file for this visit.   Signed, Kristeen Miss, MD  04/14/2023 3:05 PM    [facility]

## 2023-04-15 ENCOUNTER — Ambulatory Visit: Payer: Commercial Managed Care - PPO

## 2023-04-15 ENCOUNTER — Ambulatory Visit: Payer: Commercial Managed Care - PPO | Attending: Cardiovascular Disease | Admitting: Cardiovascular Disease

## 2024-01-21 ENCOUNTER — Ambulatory Visit
Admission: RE | Admit: 2024-01-21 | Discharge: 2024-01-21 | Disposition: A | Discharge status: Home / Self Care | Payer: Commercial Managed Care - PPO | Source: Ambulatory Visit

## 2024-01-21 ENCOUNTER — Other Ambulatory Visit: Payer: Self-pay

## 2024-01-21 VITALS — BP 132/89 | HR 77 | Temp 98.0°F | Resp 18

## 2024-01-21 DIAGNOSIS — M7918 Myalgia, other site: Secondary | ICD-10-CM | POA: Diagnosis not present

## 2024-01-21 MED ORDER — NAPROXEN 375 MG PO TABS: 375.0000 mg | ORAL_TABLET | Freq: Two times a day (BID) | ORAL | 0 refills | Status: DC | PRN

## 2024-01-21 MED ORDER — TIZANIDINE HCL 4 MG PO TABS: 4.0000 mg | ORAL_TABLET | Freq: Three times a day (TID) | ORAL | 0 refills | Status: DC | PRN

## 2024-01-21 NOTE — ED Provider Notes (Signed)
 [facility]    CSN: [id number] Arrival date & time: 01/21/24  4098      History   Chief Complaint Chief Complaint  Patient presents with   Muscle Pain    HPI Robin Castaneda is a 58 y.o. female.  Patient here with complaint of bilateral lower leg pain x one day.  Reports that her lower calfs were hurting in the night when she was sleeping. She awakened and took some Tylenol and Ibuprofen around 800 mg around 8:00 am.   Past Medical History:  Diagnosis Date   Bipolar 1 disorder (HCC)    Depression    Hypertension    Pneumonia     There are no active problems to display for this patient.   Past Surgical History:  Procedure Laterality Date   TUBAL LIGATION      OB History   No obstetric history on file.      Home Medications    Prior to Admission medications   Medication Sig Start Date End Date Taking? Authorizing Provider  buPROPion (WELLBUTRIN XL) 300 MG 24 hr tablet Take 300 mg by mouth daily. Take 0.5-1 tablets by mouth  3 (times) daily as needed for muscle spasms.   Yes [provider]  HUMIRA, 2 PEN, 40 MG/0.4ML pen SMARTSIG:40 Milligram(s) SUB-Q Every 2 Weeks 01/17/24  Yes [provider]  hydrochlorothiazide (HYDRODIURIL) 12.5 MG tablet Take 12.5 mg by mouth at bedtime. 01/09/23  Yes [provider]  lamoTRIgine (LAMICTAL) 100 MG tablet Take 300 mg by mouth daily.   Yes [provider]  LORazepam (ATIVAN) 1 MG tablet Take 1 mg by mouth daily as needed for anxiety (severe agitation).  09/12/14  Yes [provider]  naproxen (NAPROSYN) 375 MG tablet Take 1 tablet (375 mg total) by mouth 2 (two) times daily as needed for mild pain (pain score 1-3), moderate pain (pain score 4-6) or headache. 01/21/24  Yes Bing Neighbors, NP  piroxicam (FELDENE) 10 MG capsule Take 10 mg by mouth 2 (two) times daily as needed. 10/30/23  Yes [provider]  sertraline (ZOLOFT) 100 MG tablet Take 100 mg by mouth  daily.   Yes [provider]  tiZANidine (ZANAFLEX) 4 MG tablet Take 1 tablet (4 mg total) by mouth every 8 (eight) hours as needed for muscle spasms. 01/21/24  Yes Bing Neighbors, NP  escitalopram (LEXAPRO) 20 MG tablet Take 40 mg by mouth daily.  Patient not taking: Reported on 01/21/2024 09/02/14   [provider]  metoprolol tartrate (LOPRESSOR) 50 MG tablet Take 1 tablet by mouth two hours prior to scan Patient not taking: Reported on 01/21/2024 01/20/23   Nahser, Deloris Ping, MD  rosuvastatin (CRESTOR) 20 MG tablet Take 1 tablet (20 mg total) by mouth daily. Patient not taking: Reported on 01/21/2024 11/15/22   Nahser, Deloris Ping, MD    Family History Family History  Problem Relation Age of Onset   Healthy Mother    Healthy Father     Social History Social History   Tobacco Use   Smoking status: Never   Smokeless tobacco: Never  Vaping Use   Vaping status: Never Used  Substance Use Topics   Alcohol use: No   Drug use: No     Allergies   Latex and Latex   Review of Systems Review of Systems Pertinent negatives listed in HPI   Physical Exam Triage Vital Signs ED Triage Vitals  Encounter Vitals Group  BP 01/21/24 0940 132/89     Systolic BP Percentile --      Diastolic BP Percentile --      Pulse Rate 01/21/24 0940 77     Resp 01/21/24 0940 18     Temp 01/21/24 0940 98 F (36.7 C)     Temp Source 01/21/24 0940 Oral     SpO2 01/21/24 0940 98 %     Weight --      Height --      Head Circumference --      Peak Flow --      Pain Score 01/21/24 0936 7     Pain Loc --      Pain Education --      Exclude from Growth Chart --    No data found.  Updated Vital Signs BP 132/89 (BP Location: Left Arm)   Pulse 77   Temp 98 F (36.7 C) (Oral)   Resp 18   SpO2 98%   Visual Acuity Right Eye Distance:   Left Eye Distance:   Bilateral Distance:    Right Eye Near:   Left Eye Near:    Bilateral Near:     Physical Exam Vitals reviewed.   Constitutional:      Appearance: Normal appearance.  HENT:     Head: Normocephalic and atraumatic.  Eyes:     Extraocular Movements: Extraocular movements intact.     Pupils: Pupils are equal, round, and reactive to light.  Cardiovascular:     Rate and Rhythm: Normal rate and regular rhythm.  Pulmonary:     Effort: Pulmonary effort is normal.     Breath sounds: Normal breath sounds.  Musculoskeletal:        General: Tenderness present. No swelling, deformity or signs of injury.     Right lower leg: No edema.     Left lower leg: No edema.  Skin:    General: Skin is warm and dry.     Findings: No bruising or erythema.  Neurological:     General: No focal deficit present.     Mental Status: She is alert and oriented to person, place, and time.      UC Treatments / Results  Labs (all labs ordered are listed, but only abnormal results are displayed) Labs Reviewed - No data to display  EKG   Radiology No results found.  Procedures Procedures (including critical care time)  Medications Ordered in UC Medications - No data to display  Initial Impression / Assessment and Plan / UC Course  I have reviewed the triage vital signs and the nursing notes.  Pertinent labs & imaging results that were available during my care of the patient were reviewed by me and considered in my medical decision making (see chart for details).    Generalized Myalgia involving bilateral lower extremities, negative for edema or swelling, ecchymosis. Low risk for DVT based current treatment with oral contraceptives, no immobility, no recent airplane travel.  Symptoms are radiating from the left lower leg into the thigh with abrupt onset last night.  Treating for myalgias however discussed with patient cannot rule out restless leg syndrome.  Encouraged if symptoms solve and recur to follow-up with primary care provider for workup for possible restless leg syndrome. Final Clinical Impressions(s) / UC  Diagnoses   Final diagnoses:  Myalgia, lower leg     Discharge Instructions      Prescribed naproxen for lower leg pain.  Discontinue use of ibuprofen or any NSAID  related medications taking naproxen.  Tizanidine 4 mg twice daily can be taken as needed for leg pain.  If leg pain continues to recur mostly at nighttime follow-up with primary care doctor this can be a sign of restless leg syndrome.     ED Prescriptions     Medication Sig Dispense Auth. Provider   naproxen (NAPROSYN) 375 MG tablet Take 1 tablet (375 mg total) by mouth 2 (two) times daily as needed for mild pain (pain score 1-3), moderate pain (pain score 4-6) or headache. 20 tablet Bing Neighbors, NP   tiZANidine (ZANAFLEX) 4 MG tablet Take 1 tablet (4 mg total) by mouth every 8 (eight) hours as needed for muscle spasms. 30 tablet Bing Neighbors, NP      PDMP not reviewed this encounter.   Bing Neighbors, NP 01/21/24 1346

## 2024-01-21 NOTE — Discharge Instructions (Signed)
 Prescribed naproxen for lower leg pain.  Discontinue use of ibuprofen or any NSAID related medications taking naproxen.  Tizanidine 4 mg twice daily can be taken as needed for leg pain.  If leg pain continues to recur mostly at nighttime follow-up with primary care doctor this can be a sign of restless leg syndrome.

## 2024-01-21 NOTE — ED Triage Notes (Addendum)
 Bilateral calf pain. - Entered by patient  Pt reports bilateral lower leg pain "throbbing" onset last night. States she has family history of PEs and "factor 5" so she wanted to be checked. Denies injury or strenuous activity over the last few days. States pain is unchanged at rest. Denies SOB

## 2024-04-21 ENCOUNTER — Encounter (HOSPITAL_BASED_OUTPATIENT_CLINIC_OR_DEPARTMENT_OTHER): Payer: Self-pay | Admitting: Orthopedic Surgery

## 2024-04-21 ENCOUNTER — Other Ambulatory Visit: Payer: Self-pay

## 2024-04-22 ENCOUNTER — Encounter (HOSPITAL_BASED_OUTPATIENT_CLINIC_OR_DEPARTMENT_OTHER)
Admission: RE | Admit: 2024-04-22 | Discharge: 2024-04-22 | Disposition: A | Discharge status: Home / Self Care | Source: Ambulatory Visit | Attending: Orthopedic Surgery | Admitting: Orthopedic Surgery

## 2024-04-22 DIAGNOSIS — I10 Essential (primary) hypertension: Secondary | ICD-10-CM | POA: Diagnosis not present

## 2024-04-22 DIAGNOSIS — Z01818 Encounter for other preprocedural examination: Secondary | ICD-10-CM | POA: Diagnosis present

## 2024-04-22 LAB — BASIC METABOLIC PANEL WITH GFR
Anion gap: 8 (ref 5–15)
BUN: 12 mg/dL (ref 6–20)
CO2: 29 mmol/L (ref 22–32)
Calcium: 8.5 mg/dL — ABNORMAL LOW (ref 8.9–10.3)
Chloride: 103 mmol/L (ref 98–111)
Creatinine, Ser: 0.8 mg/dL (ref 0.44–1.00)
GFR, Estimated: >60 mL/min (ref >60)
Glucose, Bld: 100 mg/dL — ABNORMAL HIGH (ref 70–99)
Potassium: 3.6 mmol/L (ref 3.5–5.1)
Sodium: 140 mmol/L (ref 135–145)

## 2024-04-26 NOTE — Anesthesia Preprocedure Evaluation (Addendum)
 Anesthesia Evaluation  Patient identified by MRN, date of birth, ID band Patient awake    Reviewed: Allergy & Precautions, NPO status , Patient's Chart, lab work & pertinent test results  History of Anesthesia Complications (+) PONV and history of anesthetic complications  Airway Mallampati: II  TM Distance: >3 FB Neck ROM: Full    Dental no notable dental hx. (+) Dental Advisory Given, Teeth Intact   Pulmonary pneumonia, former smoker   Pulmonary exam normal breath sounds clear to auscultation       Cardiovascular hypertension, Pt. on medications Normal cardiovascular exam Rhythm:Regular Rate:Normal     Neuro/Psych  PSYCHIATRIC DISORDERS  Depression Bipolar Disorder   negative neurological ROS     GI/Hepatic negative GI ROS, Neg liver ROS,,,  Endo/Other   Hyperthyroidism Class 3 obesity  Renal/GU negative Renal ROS     Musculoskeletal  (+) Arthritis ,    Abdominal  (+) + obese  Peds  Hematology negative hematology ROS (+)   Anesthesia Other Findings   Reproductive/Obstetrics                             Anesthesia Physical Anesthesia Plan  ASA: 3  Anesthesia Plan: General   Post-op Pain Management: Tylenol  PO (pre-op)* and Toradol  IV (intra-op)*   Induction: Intravenous  PONV Risk Score and Plan: 4 or greater and Ondansetron, Dexamethasone , Treatment may vary due to age or medical condition, Midazolam, Propofol infusion and TIVA  Airway Management Planned: LMA  Additional Equipment:   Intra-op Plan:   Post-operative Plan: Extubation in OR  Informed Consent: I have reviewed the patients History and Physical, chart, labs and discussed the procedure including the risks, benefits and alternatives for the proposed anesthesia with the patient or authorized representative who has indicated his/her understanding and acceptance.     Dental advisory given  Plan Discussed with:  CRNA  Anesthesia Plan Comments:        Anesthesia Quick Evaluation

## 2024-04-26 NOTE — H&P (Signed)
 PREOPERATIVE H&P  Chief Complaint: left knee pain  HPI: Robin Castaneda is a 89 year young woman who is here for follow up of left knee pain.  Left knee is causing moderate to severe pain.  It has been persistent.  We have tried injections and anti-inflammatories.  She uses a walker. MRI demonstrates a complex medial meniscus tear with some extrusion   This is significantly impairing activities of daily living.  She has elected for surgical management.   Past Medical History:  Diagnosis Date   Bipolar 1 disorder (HCC)    Complication of anesthesia    Depression    Hypertension    Pneumonia    PONV (postoperative nausea and vomiting)    Psoriatic arthritis (HCC)    left   Past Surgical History:  Procedure Laterality Date   TUBAL LIGATION     Social History   Socioeconomic History   Marital status: Married    Spouse name: Not on file   Number of children: Not on file   Years of education: Not on file   Highest education level: Not on file  Occupational History   Not on file  Tobacco Use   Smoking status: Former    Types: Cigarettes   Smokeless tobacco: Never  Vaping Use   Vaping status: Never Used  Substance and Sexual Activity   Alcohol use: Yes    Comment: social   Drug use: No   Sexual activity: Yes    Birth control/protection: Post-menopausal  Other Topics Concern   Not on file  Social History Narrative   ** Merged History Encounter **       Social Drivers of Corporate investment banker Strain: Not on file  Food Insecurity: Not on file  Transportation Needs: Not on file  Physical Activity: Not on file  Stress: Not on file  Social Connections: Not on file   Family History  Problem Relation Age of Onset   Healthy Mother    Healthy Father    Allergies  Allergen Reactions   Latex    Latex Itching and Rash    Other Reaction(s): Not available   Prior to Admission medications   Medication Sig Start Date End Date Taking? Authorizing Provider  Acetaminophen   (TYLENOL ) 325 MG CAPS Tylenol    Yes [provider]  Aspirin-Acetaminophen -Caffeine (GOODYS EXTRA STRENGTH) 500-325-65 MG PACK Take by mouth.   Yes [provider]  buPROPion (WELLBUTRIN XL) 300 MG 24 hr tablet Take 300 mg by mouth daily. Take 0.5-1 tablets by mouth  3 (times) daily as needed for muscle spasms.   Yes [provider]  HUMIRA, 2 PEN, 40 MG/0.4ML pen SMARTSIG:40 Milligram(s) SUB-Q Every 2 Weeks 01/17/24  Yes [provider]  hydrochlorothiazide (HYDRODIURIL) 12.5 MG tablet Take 12.5 mg by mouth at bedtime. 01/09/23  Yes [provider]  lamoTRIgine (LAMICTAL) 100 MG tablet Take 300 mg by mouth daily.   Yes [provider]  LORazepam  (ATIVAN ) 1 MG tablet Take 1 mg by mouth daily as needed for anxiety (severe agitation).  09/12/14  Yes [provider]  sertraline (ZOLOFT) 100 MG tablet Take 100 mg by mouth daily.   Yes [provider]     Positive ROS: All other systems have been reviewed and were otherwise negative with the exception of those mentioned in the HPI and as above.  Physical Exam: General: Alert, no acute distress Cardiovascular: No pedal edema Respiratory: No cyanosis, no use of accessory musculature GI: No organomegaly, abdomen  is soft and non-tender Skin: No lesions in the area of chief complaint Neurologic: Sensation intact distally Psychiatric: Patient is competent for consent with normal mood and affect Lymphatic: No axillary or cervical lymphadenopathy  MUSCULOSKELETAL: On exam today left knee has a painful arc of motion.  She does have some medial tenderness, although her pain does not localize very well.  She is ligamentously intact.    MRI is reviewed that demonstrates a complex medial meniscus tear with some extrusion.  She has some early chondral changes.  Assessment: Left knee pain with medial meniscal pathology   Plan: Plan for Procedure(s): ARTHROSCOPY, KNEE, WITH MEDIAL  MENISCECTOMY  The risks benefits and alternatives were discussed with the patient including but not limited to the risks of nonoperative treatment, versus surgical intervention including infection, bleeding, nerve injury,  blood clots, cardiopulmonary complications, morbidity, mortality, among others, and they were willing to proceed.    Abraham Hoffmann, PA-C    04/26/2024 12:38 PM

## 2024-04-27 ENCOUNTER — Ambulatory Visit (HOSPITAL_BASED_OUTPATIENT_CLINIC_OR_DEPARTMENT_OTHER): Admitting: Anesthesiology

## 2024-04-27 ENCOUNTER — Encounter (HOSPITAL_BASED_OUTPATIENT_CLINIC_OR_DEPARTMENT_OTHER)
Admission: RE | Disposition: A | Discharge status: Home / Self Care | Payer: Self-pay | Source: Home / Self Care | Attending: Orthopedic Surgery

## 2024-04-27 ENCOUNTER — Encounter (HOSPITAL_BASED_OUTPATIENT_CLINIC_OR_DEPARTMENT_OTHER): Payer: Self-pay | Admitting: Orthopedic Surgery

## 2024-04-27 ENCOUNTER — Ambulatory Visit (HOSPITAL_BASED_OUTPATIENT_CLINIC_OR_DEPARTMENT_OTHER)
Admission: RE | Admit: 2024-04-27 | Discharge: 2024-04-27 | Disposition: A | Discharge status: Home / Self Care | Attending: Orthopedic Surgery | Admitting: Orthopedic Surgery

## 2024-04-27 ENCOUNTER — Other Ambulatory Visit: Payer: Self-pay

## 2024-04-27 DIAGNOSIS — Z87891 Personal history of nicotine dependence: Secondary | ICD-10-CM

## 2024-04-27 DIAGNOSIS — Z6839 Body mass index (BMI) 39.0-39.9, adult: Secondary | ICD-10-CM | POA: Insufficient documentation

## 2024-04-27 DIAGNOSIS — F319 Bipolar disorder, unspecified: Secondary | ICD-10-CM | POA: Diagnosis not present

## 2024-04-27 DIAGNOSIS — I10 Essential (primary) hypertension: Secondary | ICD-10-CM

## 2024-04-27 DIAGNOSIS — M1990 Unspecified osteoarthritis, unspecified site: Secondary | ICD-10-CM | POA: Insufficient documentation

## 2024-04-27 DIAGNOSIS — Z79899 Other long term (current) drug therapy: Secondary | ICD-10-CM | POA: Diagnosis not present

## 2024-04-27 DIAGNOSIS — S83242A Other tear of medial meniscus, current injury, left knee, initial encounter: Secondary | ICD-10-CM | POA: Diagnosis present

## 2024-04-27 DIAGNOSIS — E66813 Obesity, class 3: Secondary | ICD-10-CM | POA: Insufficient documentation

## 2024-04-27 DIAGNOSIS — M2342 Loose body in knee, left knee: Secondary | ICD-10-CM

## 2024-04-27 HISTORY — PX: KNEE ARTHROSCOPY WITH MEDIAL MENISECTOMY: SHX5651

## 2024-04-27 HISTORY — DX: Other specified postprocedural states: Z98.890

## 2024-04-27 HISTORY — DX: Other complications of anesthesia, initial encounter: T88.59XA

## 2024-04-27 HISTORY — DX: Arthropathic psoriasis, unspecified: L40.50

## 2024-04-27 SURGERY — ARTHROSCOPY, KNEE, WITH MEDIAL MENISCECTOMY
Anesthesia: General | Site: Knee | Laterality: Left

## 2024-04-27 MED ORDER — LIDOCAINE 2% (20 MG/ML) 5 ML SYRINGE
INTRAMUSCULAR | Status: DC | PRN
  Administered 2024-04-27: 100 mg via INTRAVENOUS

## 2024-04-27 MED ORDER — PROPOFOL 500 MG/50ML IV EMUL
INTRAVENOUS | Status: AC
  Filled 2024-04-27: qty 50

## 2024-04-27 MED ORDER — BUPIVACAINE HCL (PF) 0.5 % IJ SOLN
INTRAMUSCULAR | Status: DC | PRN
  Administered 2024-04-27: 20 mL

## 2024-04-27 MED ORDER — ONDANSETRON HCL 4 MG/2ML IJ SOLN
INTRAMUSCULAR | Status: AC
  Filled 2024-04-27: qty 2

## 2024-04-27 MED ORDER — CEFAZOLIN SODIUM-DEXTROSE 2-4 GM/100ML-% IV SOLN
2.0000 g | INTRAVENOUS | Status: AC
  Administered 2024-04-27: 2 g via INTRAVENOUS

## 2024-04-27 MED ORDER — PROPOFOL 10 MG/ML IV BOLUS
INTRAVENOUS | Status: AC
  Filled 2024-04-27: qty 20

## 2024-04-27 MED ORDER — ONDANSETRON HCL 4 MG/2ML IJ SOLN
INTRAMUSCULAR | Status: DC | PRN
Start: 2024-04-27 — End: 2024-04-27
  Administered 2024-04-27: 4 mg via INTRAVENOUS

## 2024-04-27 MED ORDER — DEXAMETHASONE SODIUM PHOSPHATE 10 MG/ML IJ SOLN
INTRAMUSCULAR | Status: AC
  Filled 2024-04-27: qty 2

## 2024-04-27 MED ORDER — POVIDONE-IODINE 10 % EX SWAB
2.0000 | Freq: Once | CUTANEOUS | Status: AC
Start: 2024-04-27 — End: 2024-04-27
  Administered 2024-04-27: 2 via TOPICAL

## 2024-04-27 MED ORDER — BUPIVACAINE HCL (PF) 0.5 % IJ SOLN
INTRAMUSCULAR | Status: AC
  Filled 2024-04-27: qty 30

## 2024-04-27 MED ORDER — FENTANYL CITRATE (PF) 100 MCG/2ML IJ SOLN
INTRAMUSCULAR | Status: AC
  Filled 2024-04-27: qty 2

## 2024-04-27 MED ORDER — SODIUM CHLORIDE 0.9 % IR SOLN
Status: DC | PRN
  Administered 2024-04-27: 6000 mL

## 2024-04-27 MED ORDER — POVIDONE-IODINE 7.5 % EX SOLN
Freq: Once | CUTANEOUS | Status: AC
  Filled 2024-04-27: qty 118

## 2024-04-27 MED ORDER — ONDANSETRON HCL 4 MG/2ML IJ SOLN
INTRAMUSCULAR | Status: AC
  Filled 2024-04-27: qty 4

## 2024-04-27 MED ORDER — KETOROLAC TROMETHAMINE 30 MG/ML IJ SOLN
INTRAMUSCULAR | Status: AC
  Filled 2024-04-27: qty 1

## 2024-04-27 MED ORDER — LIDOCAINE 2% (20 MG/ML) 5 ML SYRINGE
INTRAMUSCULAR | Status: AC
  Filled 2024-04-27: qty 5

## 2024-04-27 MED ORDER — DEXAMETHASONE SODIUM PHOSPHATE 10 MG/ML IJ SOLN
INTRAMUSCULAR | Status: AC
  Filled 2024-04-27: qty 1

## 2024-04-27 MED ORDER — MIDAZOLAM HCL 2 MG/2ML IJ SOLN
INTRAMUSCULAR | Status: AC
  Filled 2024-04-27: qty 2

## 2024-04-27 MED ORDER — FENTANYL CITRATE (PF) 100 MCG/2ML IJ SOLN
INTRAMUSCULAR | Status: DC | PRN
  Administered 2024-04-27 (×2): 50 ug via INTRAVENOUS

## 2024-04-27 MED ORDER — PROPOFOL 10 MG/ML IV BOLUS
INTRAVENOUS | Status: DC | PRN
  Administered 2024-04-27: 200 mg via INTRAVENOUS

## 2024-04-27 MED ORDER — ACETAMINOPHEN 500 MG PO TABS: 1000.0000 mg | ORAL_TABLET | Freq: Once | ORAL | Status: DC

## 2024-04-27 MED ORDER — MIDAZOLAM HCL 5 MG/5ML IJ SOLN
INTRAMUSCULAR | Status: DC | PRN
  Administered 2024-04-27: 2 mg via INTRAVENOUS

## 2024-04-27 MED ORDER — BUPIVACAINE HCL (PF) 0.25 % IJ SOLN
INTRAMUSCULAR | Status: AC
  Filled 2024-04-27: qty 30

## 2024-04-27 MED ORDER — HYDROCODONE-ACETAMINOPHEN 5-325 MG PO TABS: 1.0000 | ORAL_TABLET | Freq: Four times a day (QID) | ORAL | 0 refills | Status: AC | PRN

## 2024-04-27 MED ORDER — CEFAZOLIN SODIUM-DEXTROSE 2-4 GM/100ML-% IV SOLN
INTRAVENOUS | Status: AC
Start: 2024-04-27 — End: ?
  Filled 2024-04-27: qty 100

## 2024-04-27 MED ORDER — ACETAMINOPHEN 500 MG PO TABS
ORAL_TABLET | ORAL | Status: AC
  Filled 2024-04-27: qty 2

## 2024-04-27 MED ORDER — DEXAMETHASONE SODIUM PHOSPHATE 10 MG/ML IJ SOLN
INTRAMUSCULAR | Status: DC | PRN
  Administered 2024-04-27: 5 mg via INTRAVENOUS

## 2024-04-27 MED ORDER — KETOROLAC TROMETHAMINE 30 MG/ML IJ SOLN
INTRAMUSCULAR | Status: DC | PRN
  Administered 2024-04-27: 30 mg via INTRAVENOUS

## 2024-04-27 MED ORDER — LACTATED RINGERS IV SOLN: INTRAVENOUS | Status: DC

## 2024-04-27 MED ORDER — ACETAMINOPHEN 500 MG PO TABS
1000.0000 mg | ORAL_TABLET | Freq: Once | ORAL | Status: AC
  Administered 2024-04-27: 1000 mg via ORAL

## 2024-04-27 SURGICAL SUPPLY — 27 items
BNDG ELASTIC 6INX 5YD STR LF (GAUZE/BANDAGES/DRESSINGS) ×1 IMPLANT
CLSR STERI-STRIP ANTIMIC 1/2X4 (GAUZE/BANDAGES/DRESSINGS) ×1 IMPLANT
DISSECTOR 3.8MM X 13CM (MISCELLANEOUS) ×1 IMPLANT
DISSECTOR 4.0MM X 13CM (MISCELLANEOUS) IMPLANT
DRAPE ARTHROSCOPY W/POUCH 90 (DRAPES) ×1 IMPLANT
DRAPE IMP U-DRAPE 54X76 (DRAPES) ×1 IMPLANT
DURAPREP 26ML APPLICATOR (WOUND CARE) ×1 IMPLANT
ELECTRODE REM PT RTRN 9FT ADLT (ELECTROSURGICAL) IMPLANT
GAUZE SPONGE 4X4 12PLY STRL (GAUZE/BANDAGES/DRESSINGS) ×1 IMPLANT
GLOVE BIO SURGEON STRL SZ7 (GLOVE) ×1 IMPLANT
GLOVE BIOGEL PI IND STRL 7.0 (GLOVE) ×1 IMPLANT
GLOVE BIOGEL PI IND STRL 8 (GLOVE) ×2 IMPLANT
GLOVE ORTHO TXT STRL SZ7.5 (GLOVE) ×1 IMPLANT
GOWN STRL REUS W/ TWL LRG LVL3 (GOWN DISPOSABLE) ×1 IMPLANT
GOWN STRL REUS W/ TWL XL LVL3 (GOWN DISPOSABLE) ×2 IMPLANT
MANIFOLD NEPTUNE II (INSTRUMENTS) ×1 IMPLANT
PACK ARTHROSCOPY DSU (CUSTOM PROCEDURE TRAY) ×1 IMPLANT
PACK BASIN DAY SURGERY FS (CUSTOM PROCEDURE TRAY) ×1 IMPLANT
PENCIL SMOKE EVACUATOR (MISCELLANEOUS) IMPLANT
SLEEVE SCD COMPRESS KNEE MED (STOCKING) IMPLANT
SOL .9 NS 3000ML IRR UROMATIC (IV SOLUTION) ×2 IMPLANT
SUT MNCRL AB 4-0 PS2 18 (SUTURE) ×1 IMPLANT
TOWEL GREEN STERILE FF (TOWEL DISPOSABLE) ×1 IMPLANT
TUBING ARTHROSCOPY IRRIG 16FT (MISCELLANEOUS) ×1 IMPLANT
WAND ABLATOR APOLLO I90 (BUR) IMPLANT
WATER STERILE IRR 1000ML POUR (IV SOLUTION) ×1 IMPLANT
WRAP KNEE MAXI GEL POST OP (GAUZE/BANDAGES/DRESSINGS) ×1 IMPLANT

## 2024-04-27 NOTE — Interval H&P Note (Signed)
 History and Physical Interval Note:  04/27/2024 7:17 AM  Robin Castaneda  has presented today for surgery, with the diagnosis of medial meniscus tear of left knee.  The various methods of treatment have been discussed with the patient and family. After consideration of risks, benefits and other options for treatment, the patient has consented to  Procedure(s): ARTHROSCOPY, KNEE, WITH MEDIAL MENISCECTOMY (Left) as a surgical intervention.  The patient's history has been reviewed, patient examined, no change in status, stable for surgery.  I have reviewed the patient's chart and labs.  Questions were answered to the patient's satisfaction.     Neville Barbone

## 2024-04-27 NOTE — Anesthesia Postprocedure Evaluation (Signed)
 Anesthesia Post Note  Patient: Robin Castaneda  Procedure(s) Performed: ARTHROSCOPY, KNEE, WITH MEDIAL MENISCECTOMY (Left: Knee)     Patient location during evaluation: PACU Anesthesia Type: General Level of consciousness: sedated and patient cooperative Pain management: pain level controlled Vital Signs Assessment: post-procedure vital signs reviewed and stable Respiratory status: spontaneous breathing Cardiovascular status: stable Anesthetic complications: no   No notable events documented.  Last Vitals:  Vitals:   04/27/24 0845 04/27/24 0850  BP: (!) 157/81 (!) 157/82  Pulse: (!) 57 (!) 59  Resp: 15 17  Temp:    SpO2: 97% 95%    Last Pain:  Vitals:   04/27/24 0850  TempSrc:   PainSc: 0-No pain                 Gorman Laughter

## 2024-04-27 NOTE — Op Note (Signed)
 04/27/2024  8:14 AM  PATIENT:  Robin Castaneda    PRE-OPERATIVE DIAGNOSIS:  medial meniscus tear of left knee  POST-OPERATIVE DIAGNOSIS:  Same  PROCEDURE:  ARTHROSCOPY, KNEE, WITH MEDIAL MENISCECTOMY  SURGEON:  Neville Barbone, MD  PHYSICIAN ASSISTANT: Hurshel Maidens, PA-C, present and scrubbed throughout the case, critical for completion in a timely fashion, and for retraction, instrumentation, and closure.  ANESTHESIA:   General  PREOPERATIVE INDICATIONS:  [name] is a  58 y.o. female with a diagnosis of medial meniscus tear of left knee who failed conservative measures and elected for surgical management.    The risks benefits and alternatives were discussed with the patient preoperatively including but not limited to the risks of infection, bleeding, nerve injury, cardiopulmonary complications, the need for revision surgery, among others, and the patient was willing to proceed.  ESTIMATED BLOOD LOSS: Minimal  OPERATIVE IMPLANTS:   * No implants in log *  OPERATIVE FINDINGS: Extensive grade 2 changes in the patellofemoral joint as well as the medial compartment with some grade 3 changes on the medial femoral condyle and a 1 x 1 cm patch.  It looks like there may be 2 small loose bodies that were contained within the soft tissue of the anterior part of the knee.  The lateral compartment looked intact, the medial compartment had a deep radial tear of the posterior horn.  OPERATIVE PROCEDURE: The patient was brought to the operating room and placed in the supine position.  General anesthesia was administered.  Let the left lower extremity was prepped and draped in usual sterile fashion.  Timeout performed.  Diagnostic arthroscopy was carried out with the above named findings.  I used the arthroscopic basket and the arthroscopic shaver to debride the medial meniscus back to a stable configuration.  This was effectively tapered to a smooths position.  The instruments were  removed, the knee was drained, injected with Marcaine, the portals closed with Monocryl with Steri-Strips and sterile gauze.  She was awakened and returned to the PACU in stable and satisfactory condition.  There were no complications and she tolerated the procedure well.  She did have 1 blemish of what looked like an insect bite on the skin that was a outside of the immediate surgical field which we covered with a Tegaderm during the entirety of the case.

## 2024-04-27 NOTE — Transfer of Care (Signed)
 Immediate Anesthesia Transfer of Care Note  Patient: Robin Castaneda  Procedure(s) Performed: ARTHROSCOPY, KNEE, WITH MEDIAL MENISCECTOMY (Left: Knee)  Patient Location: PACU  Anesthesia Type:General  Level of Consciousness: awake and patient cooperative  Airway & Oxygen Therapy: Patient Spontanous Breathing and Patient connected to face mask oxygen  Post-op Assessment: Report given to RN and Post -op Vital signs reviewed and stable  Post vital signs: Reviewed and stable  Last Vitals:  Vitals Value Taken Time  BP 149/79 04/27/24 0830  Temp    Pulse 67 04/27/24 0831  Resp 14 04/27/24 0831  SpO2 98 % 04/27/24 0831  Vitals shown include unfiled device data.  Last Pain:  Vitals:   04/27/24 0639  TempSrc: Oral  PainSc: 8       Patients Stated Pain Goal: 5 (04/27/24 8119)  Complications: No notable events documented.

## 2024-04-27 NOTE — Discharge Instructions (Addendum)
 Diet: Start with some clear liquids, soups, etc, and advance to your regular diet as tolerated.   Dressing:  You may remove your dressing 3-5 days after surgery and shower.  There are steri-strips (white strips) over the incisions.  Your stitches are absorbable.  Leave the steri-strips in place when changing your dressings, they will peel off with time, usually 2-3 weeks.  Keep your wounds covered with band-aids/gauze until your first post-op appointment.  Activity:  Increase activity slowly as tolerated, but follow the weight bearing instructions below.  You cannot drive while taking narcotics.    Weight Bearing:   As tolerated, unless otherwise instructed.  If you had a femoral nerve block, use crutches and your knee immobilizer until the nerve block wears off (usually 12-24 hrs).  After the block has worn off, you may remove the knee immobilizer as soon as you feel that your leg can support you without the brace.    Medications:  You will want to take some of your pain medications tonight before going to bed to make sure you have something in your system when the numbing medicine/block wears off.  The maximum dose of Tylenol /Acetaminophen  in a day is 3,000-4,000 mg, and beware that your pain medication may have Tylenol  (acetaminophen ) in it.  As your pain improves you can begin to taper the amount of narcotic you are using.  You can also use ibuprofen/motrin/NSAIDs.  To prevent constipation: you may use a stool softener such as - Colace (over the counter) 100 mg by mouth twice a day, Drink plenty of fluids (prune juice may be helpful) and high fiber foods, Miralax (over the counter) for constipation as needed.    Itching:  If you experience itching or other side effects with your pain medications, try taking only a single pain pill, or even half a pain pill at a time.  You can use benadryl  for itching or also to help with sleep.   Precautions:  If you experience chest pain or shortness of breath -  call 911 immediately for transfer to the hospital emergency department!!  If you develop a fever greater that 101 F, purulent drainage from wound, increased redness or drainage from wound, or calf pain -- Call the office at [phone]                                                 Follow- Up Appointment:  Please call for an appointment to be seen in 2 weeks [phone] in [place].    After-Hours:  We have an Urgent Care available for after-hours emergencies located at the [facility] office at [facility] in [place] open from 5:30p-9p every night, and from 10a-2p on Saturday and Sunday.  There is also an on call provider after fours available for urgent questions that can be reached at [phone]     No Tylenol  until 12:41pm today, if needed.

## 2024-04-27 NOTE — Anesthesia Procedure Notes (Signed)
 Procedure Name: LMA Insertion Date/Time: 04/27/2024 7:37 AM  Performed by: Glorya Larsson, CRNAPre-anesthesia Checklist: Patient identified, Emergency Drugs available, Suction available and Patient being monitored Patient Re-evaluated:Patient Re-evaluated prior to induction Oxygen Delivery Method: Circle System Utilized Preoxygenation: Pre-oxygenation with 100% oxygen Induction Type: IV induction Ventilation: Mask ventilation without difficulty LMA: LMA inserted LMA Size: 4.0 Number of attempts: 1 Airway Equipment and Method: Bite block Placement Confirmation: positive ETCO2 Tube secured with: Tape Dental Injury: Teeth and Oropharynx as per pre-operative assessment

## 2024-04-28 ENCOUNTER — Encounter (HOSPITAL_BASED_OUTPATIENT_CLINIC_OR_DEPARTMENT_OTHER): Payer: Self-pay | Admitting: Orthopedic Surgery

## 2024-11-11 ENCOUNTER — Ambulatory Visit
Admission: RE | Admit: 2024-11-11 | Discharge: 2024-11-11 | Disposition: A | Discharge status: Home / Self Care | Source: Ambulatory Visit

## 2024-11-11 VITALS — BP 136/82 | HR 79 | Temp 98.5°F | Resp 20 | Wt 257.3 lb

## 2024-11-11 DIAGNOSIS — J069 Acute upper respiratory infection, unspecified: Secondary | ICD-10-CM

## 2024-11-11 DIAGNOSIS — M1712 Unilateral primary osteoarthritis, left knee: Secondary | ICD-10-CM | POA: Insufficient documentation

## 2024-11-11 LAB — POC COVID19/FLU A&B COMBO
Covid Antigen, POC: NEGATIVE
Influenza A Antigen, POC: NEGATIVE
Influenza B Antigen, POC: NEGATIVE

## 2024-11-11 NOTE — ED Provider Notes (Signed)
 [facility]    CSN: [id number] Arrival date & time: 11/11/24  1655      History   Chief Complaint Chief Complaint  Patient presents with   Otalgia   fever   Nasal Congestion   Fatigue   Jaw Pain    L jaw     HPI Robin Castaneda is a 58 y.o. female.   Pt presents today due 3 days worth of nasal congestion, cough, body aches, reduced appetite, and subjective fever. Pt states that she has been exposed to influenza A. Pt states that she has not been taking any medications for symptoms.   The history is provided by the patient.  Otalgia   Past Medical History:  Diagnosis Date   Bipolar 1 disorder (HCC)    Complication of anesthesia    Depression    Hypertension    Pneumonia    PONV (postoperative nausea and vomiting)    Psoriatic arthritis (HCC)    left    Patient Active Problem List   Diagnosis Date Noted   Primary osteoarthritis of left knee 11/11/2024   Chronic pain of left knee 10/26/2024   Trigger thumb of right hand 09/04/2020   Pain in right hand 06/29/2020   Hyperthyroidism 09/26/2016   Hashimoto's thyroiditis 09/26/2016    Past Surgical History:  Procedure Laterality Date   KNEE ARTHROSCOPY WITH MEDIAL MENISECTOMY Left 04/27/2024   Procedure: ARTHROSCOPY, KNEE, WITH MEDIAL MENISCECTOMY;  Surgeon: Josefina Chew, MD;  Location: [facility];  Service: Orthopedics;  Laterality: Left;   TUBAL LIGATION      OB History     Gravida  3   Para  3   Term  3   Preterm      AB      Living         SAB      IAB      Ectopic      Multiple      Live Births               Home Medications    Prior to Admission medications  Medication Sig Start Date End Date Taking? Authorizing Provider  acetaminophen  (TYLENOL ) 325 MG tablet Take 1,000 mg by mouth every 6 (six) hours as needed.   Yes [provider]  AMJEVITA 40 MG/0.4ML SOAJ  09/07/24  Yes [provider]  buPROPion  (WELLBUTRIN XL) 300 MG 24 hr tablet Take 300 mg by mouth daily. Take 0.5-1 tablets by mouth  3 (times) daily as needed for muscle spasms.   Yes [provider]  diclofenac (VOLTAREN) 75 MG EC tablet Take 75 mg by mouth 2 (two) times daily as needed. 10/15/24  Yes [provider]  lamoTRIgine (LAMICTAL) 100 MG tablet Take 300 mg by mouth daily.   Yes [provider]  meloxicam (MOBIC) 15 MG tablet SMARTSIG:1 Tablet(s) By Mouth 07/05/24  Yes [provider]  piroxicam (FELDENE) 10 MG capsule TAKE 1 CAPSULE BY MOUTH 1-2 TIMES DAILY WITH FOOD 10/29/23  Yes [provider]  sertraline (ZOLOFT) 100 MG tablet Take 100 mg by mouth daily.   Yes [provider]  Acetaminophen  (TYLENOL ) 325 MG CAPS Take 1,000 mg by mouth daily as needed.    [provider]  Aspirin-Acetaminophen -Caffeine (GOODYS EXTRA STRENGTH) 500-325-65 MG PACK Take by mouth.    [provider]  HUMIRA, 2 PEN, 40 MG/0.4ML pen SMARTSIG:40 Milligram(s) SUB-Q Every 2 Weeks 01/17/24   [provider]  hydrochlorothiazide (HYDRODIURIL) 12.5 MG tablet Take 12.5 mg by mouth at bedtime. 01/09/23   [provider]  HYDROcodone -acetaminophen  (NORCO/VICODIN) 5-325 MG tablet Take 1 tablet by mouth every 6 (six) hours as needed for severe pain (pain score 7-10). 04/27/24   Brown, Blaine K, PA-C  LORazepam  (ATIVAN ) 1 MG tablet Take 1 mg by mouth daily as needed for anxiety (severe agitation).  09/12/14   [provider]  rosuvastatin  (CRESTOR ) 20 MG tablet Take 1 tablet by mouth daily.    [provider]  tiZANidine  (ZANAFLEX ) 4 MG tablet Take 4 mg by mouth every 8 (eight) hours as needed for muscle spasms.    [provider]    Family History Family History  Problem Relation Age of Onset   Healthy Mother    Healthy Father     Social History Social History[1]   Allergies   Latex and Latex   Review of Systems Review of Systems   HENT:  Positive for ear pain.      Physical Exam Triage Vital Signs ED Triage Vitals  Encounter Vitals Group     BP 11/11/24 1722 136/82     Girls Systolic BP Percentile --      Girls Diastolic BP Percentile --      Boys Systolic BP Percentile --      Boys Diastolic BP Percentile --      Pulse Rate 11/11/24 1722 79     Resp 11/11/24 1722 20     Temp 11/11/24 1722 98.5 F (36.9 C)     Temp Source 11/11/24 1722 Oral     SpO2 11/11/24 1722 96 %     Weight 11/11/24 1721 257 lb 4.4 oz (116.7 kg)     Height --      Head Circumference --      Peak Flow --      Pain Score 11/11/24 1719 5     Pain Loc --      Pain Education --      Exclude from Growth Chart --    No data found.  Updated Vital Signs BP 136/82 (BP Location: Right Arm)   Pulse 79   Temp 98.5 F (36.9 C) (Oral)   Resp 20   Wt 257 lb 4.4 oz (116.7 kg)   SpO2 96%   BMI 39.12 kg/m   Visual Acuity Right Eye Distance:   Left Eye Distance:   Bilateral Distance:    Right Eye Near:   Left Eye Near:    Bilateral Near:     Physical Exam Vitals and nursing note reviewed.  Constitutional:      General: She is not in acute distress.    Appearance: Normal appearance. She is not ill-appearing, toxic-appearing or diaphoretic.  HENT:     Nose: Congestion (mildly enlarged turbinates) present. No rhinorrhea.     Mouth/Throat:     Mouth: Mucous membranes are moist.     Pharynx: Oropharynx is clear. No oropharyngeal exudate or posterior oropharyngeal erythema.  Eyes:     General: No scleral icterus. Cardiovascular:     Rate and Rhythm: Normal rate and regular rhythm.     Heart sounds: Normal heart sounds.  Pulmonary:     Effort: Pulmonary effort is normal. No respiratory distress.     Breath sounds: Normal breath sounds. No wheezing or rhonchi.  Skin:    General: Skin is warm and dry.  Neurological:     Mental Status: She is alert and oriented to person,  place, and time.  Psychiatric:        Mood and Affect:  Mood normal.        Behavior: Behavior normal.      UC Treatments / Results  Labs (all labs ordered are listed, but only abnormal results are displayed) Labs Reviewed  POC COVID19/FLU A&B COMBO    EKG   Radiology No results found.  Procedures Procedures (including critical care time)  Medications Ordered in UC Medications - No data to display  Initial Impression / Assessment and Plan / UC Course  I have reviewed the triage vital signs and the nursing notes.  Pertinent labs & imaging results that were available during my care of the patient were reviewed by me and considered in my medical decision making (see chart for details).     Final Clinical Impressions(s) / UC Diagnoses   Final diagnoses:  None   Discharge Instructions   None    ED Prescriptions   None    PDMP not reviewed this encounter.      [1] Social History Tobacco Use   Smoking status: Former    Types: Cigarettes    Passive exposure: Current   Smokeless tobacco: Never  Vaping Use   Vaping status: Never Used  Substance Use Topics   Alcohol use: Yes    Comment: social   Drug use: No    Andra Corean BROCKS, PA-C 11/11/24 2008

## 2024-11-11 NOTE — Discharge Instructions (Signed)

## 2024-11-11 NOTE — ED Triage Notes (Signed)
 Pt presents c/o L otalgia, fever, congestion, L jaw pain x 3 days. Pt states,  I feel like poop. Runny nose, congestion, low grade temp, headache, sinus pressure, my left ear hurts, and my left jaw hurts  Pt denies emesis and diarrhea.
